# Patient Record
Sex: Male | Born: 1975
Health system: Southern US, Community
[De-identification: ages and names within clinical notes are randomized; demographics above are authoritative.]

## PROBLEM LIST (undated history)

## (undated) DIAGNOSIS — I10 Essential (primary) hypertension: Secondary | ICD-10-CM

## (undated) DIAGNOSIS — J45909 Unspecified asthma, uncomplicated: Secondary | ICD-10-CM

## (undated) HISTORY — PX: NO PAST SURGERIES: SHX2092

---

## 2001-07-15 ENCOUNTER — Emergency Department (HOSPITAL_COMMUNITY): Admission: EM | Admit: 2001-07-15 | Discharge: 2001-07-15 | Payer: Self-pay | Admitting: Emergency Medicine

## 2001-07-26 ENCOUNTER — Emergency Department (HOSPITAL_COMMUNITY): Admission: EM | Admit: 2001-07-26 | Discharge: 2001-07-26 | Payer: Self-pay | Admitting: Emergency Medicine

## 2002-02-05 ENCOUNTER — Emergency Department (HOSPITAL_COMMUNITY): Admission: EM | Admit: 2002-02-05 | Discharge: 2002-02-05 | Payer: Self-pay | Admitting: *Deleted

## 2002-10-02 ENCOUNTER — Emergency Department (HOSPITAL_COMMUNITY): Admission: EM | Admit: 2002-10-02 | Discharge: 2002-10-02 | Payer: Self-pay | Admitting: *Deleted

## 2002-10-20 ENCOUNTER — Emergency Department (HOSPITAL_COMMUNITY): Admission: EM | Admit: 2002-10-20 | Discharge: 2002-10-20 | Payer: Self-pay | Admitting: Emergency Medicine

## 2002-11-27 ENCOUNTER — Emergency Department (HOSPITAL_COMMUNITY): Admission: EM | Admit: 2002-11-27 | Discharge: 2002-11-27 | Payer: Self-pay | Admitting: Emergency Medicine

## 2003-07-15 ENCOUNTER — Emergency Department (HOSPITAL_COMMUNITY): Admission: EM | Admit: 2003-07-15 | Discharge: 2003-07-16 | Payer: Self-pay | Admitting: *Deleted

## 2003-11-09 ENCOUNTER — Emergency Department (HOSPITAL_COMMUNITY): Admission: EM | Admit: 2003-11-09 | Discharge: 2003-11-10 | Payer: Self-pay | Admitting: *Deleted

## 2004-01-04 ENCOUNTER — Emergency Department (HOSPITAL_COMMUNITY): Admission: EM | Admit: 2004-01-04 | Discharge: 2004-01-04 | Payer: Self-pay | Admitting: Emergency Medicine

## 2004-03-19 ENCOUNTER — Emergency Department (HOSPITAL_COMMUNITY): Admission: EM | Admit: 2004-03-19 | Discharge: 2004-03-19 | Payer: Self-pay | Admitting: *Deleted

## 2004-06-15 ENCOUNTER — Emergency Department (HOSPITAL_COMMUNITY): Admission: EM | Admit: 2004-06-15 | Discharge: 2004-06-15 | Payer: Self-pay | Admitting: Emergency Medicine

## 2006-05-15 ENCOUNTER — Emergency Department (HOSPITAL_COMMUNITY): Admission: EM | Admit: 2006-05-15 | Discharge: 2006-05-15 | Payer: Self-pay | Admitting: Emergency Medicine

## 2008-02-02 ENCOUNTER — Emergency Department (HOSPITAL_COMMUNITY): Admission: EM | Admit: 2008-02-02 | Discharge: 2008-02-02 | Payer: Self-pay | Admitting: Emergency Medicine

## 2009-02-15 ENCOUNTER — Emergency Department (HOSPITAL_COMMUNITY): Admission: EM | Admit: 2009-02-15 | Discharge: 2009-02-15 | Payer: Self-pay | Admitting: Emergency Medicine

## 2009-07-29 ENCOUNTER — Emergency Department (HOSPITAL_COMMUNITY): Admission: EM | Admit: 2009-07-29 | Discharge: 2009-07-29 | Payer: Self-pay | Admitting: Emergency Medicine

## 2010-03-30 ENCOUNTER — Ambulatory Visit: Payer: Self-pay | Admitting: Cardiology

## 2011-12-05 ENCOUNTER — Ambulatory Visit: Payer: Self-pay | Admitting: Family Medicine

## 2011-12-22 ENCOUNTER — Ambulatory Visit: Payer: Self-pay | Admitting: Family Medicine

## 2012-01-05 ENCOUNTER — Ambulatory Visit: Payer: Medicaid Other | Admitting: Family Medicine

## 2012-04-22 ENCOUNTER — Encounter (HOSPITAL_COMMUNITY): Payer: Self-pay | Admitting: *Deleted

## 2012-04-22 ENCOUNTER — Emergency Department (HOSPITAL_COMMUNITY)
Admission: EM | Admit: 2012-04-22 | Discharge: 2012-04-22 | Disposition: A | Discharge status: Home / Self Care | Payer: Medicaid Other | Attending: Emergency Medicine | Admitting: Emergency Medicine

## 2012-04-22 DIAGNOSIS — L732 Hidradenitis suppurativa: Secondary | ICD-10-CM | POA: Insufficient documentation

## 2012-04-22 DIAGNOSIS — F17200 Nicotine dependence, unspecified, uncomplicated: Secondary | ICD-10-CM | POA: Insufficient documentation

## 2012-04-22 DIAGNOSIS — S40869A Insect bite (nonvenomous) of unspecified upper arm, initial encounter: Secondary | ICD-10-CM

## 2012-04-22 DIAGNOSIS — I10 Essential (primary) hypertension: Secondary | ICD-10-CM | POA: Insufficient documentation

## 2012-04-22 HISTORY — DX: Essential (primary) hypertension: I10

## 2012-04-22 MED ORDER — DOXYCYCLINE HYCLATE 100 MG PO TABS
100.0000 mg | ORAL_TABLET | Freq: Once | ORAL | Status: AC
  Administered 2012-04-22: 100 mg via ORAL
  Filled 2012-04-22: qty 1

## 2012-04-22 MED ORDER — DOXYCYCLINE HYCLATE 100 MG PO CAPS: 100.0000 mg | ORAL_CAPSULE | Freq: Two times a day (BID) | ORAL | Status: AC

## 2012-04-22 MED ORDER — HYDROCODONE-ACETAMINOPHEN 5-325 MG PO TABS: ORAL_TABLET | ORAL | Status: AC

## 2012-04-22 MED ORDER — OXYCODONE-ACETAMINOPHEN 5-325 MG PO TABS
1.0000 | ORAL_TABLET | Freq: Once | ORAL | Status: AC
  Administered 2012-04-22: 1 via ORAL
  Filled 2012-04-22: qty 1

## 2012-04-22 NOTE — ED Provider Notes (Signed)
History     CSN: [id number]  Arrival date & time 04/22/12  2104   First MD Initiated Contact with Patient 04/22/12 2128      Chief Complaint  Patient presents with  . Insect Bite    (Consider location/radiation/quality/duration/timing/severity/associated sxs/prior treatment) HPI Comments: Patient c/o insect bite to his right upper arm and an abscess to the left axilla.  Symptoms have been present for several days.  C/o pain to the axilla and redness and itching to his upper right arm.  He denies fever, tick bite, joint pain or headache.  He states he has recently changed deodrant  The history is provided by the patient.    Past Medical History  Diagnosis Date  . Hypertension     History reviewed. No pertinent past surgical history.  History reviewed. No pertinent family history.  History  Substance Use Topics  . Smoking status: Current Everyday Smoker  . Smokeless tobacco: Not on file  . Alcohol Use: Yes      Review of Systems  Constitutional: Negative for fever and chills.  HENT: Negative for neck pain.   Musculoskeletal: Negative for arthralgias.  Skin:       Insect bite and abscess  Neurological: Negative for dizziness and headaches.  All other systems reviewed and are negative.    Allergies  Review of patient's allergies indicates no known allergies.  Home Medications   Current Outpatient Rx  Name Route Sig Dispense Refill  . ALBUTEROL SULFATE HFA 108 (90 BASE) MCG/ACT IN AERS Inhalation Inhale 2 puffs into the lungs every 6 (six) hours as needed.    Marland Kitchen UNKNOWN TO PATIENT Oral Take 1 tablet by mouth daily. For BP Medication      BP 133/85  Pulse 96  Temp 99.6 F (37.6 C) (Oral)  Resp 20  Ht 6\' 1"  (1.854 m)  Wt 312 lb (141.522 kg)  BMI 41.16 kg/m2  SpO2 99%  Physical Exam  Nursing note and vitals reviewed. Constitutional: He is oriented to person, place, and time. He appears well-developed and well-nourished. No distress.  HENT:  Head:  Normocephalic and atraumatic.  Cardiovascular: Normal rate, regular rhythm and normal heart sounds.   Pulmonary/Chest: Effort normal and breath sounds normal.  Musculoskeletal: He exhibits no edema.  Neurological: He is alert and oriented to person, place, and time. He exhibits normal muscle tone. Coordination normal.  Skin: Skin is warm. There is erythema.          Erythematous, indurated papule to the left axilla.  No surrounding erythema,  drainage or fluctuance.      ED Course  Procedures (including critical care time)      MDM    Hydradenitis of the of the left axillary region.  No significant fluctuance at this time. No drainage. Erythematous plaque to right upper arm.    Patient / Family / Caregiver understand and agree with initial ED impression and plan with expectations set for ED visit. Pt stable in ED with no significant deterioration in condition. Pt feels improved after observation and/or treatment in ED.     Prescribed:  Doxy norco #20  Diesha Rostad L. Meng [place], [place] 04/25/12 2044  Derinda Bartus L. [place], [place] 04/25/12 2045

## 2012-04-22 NOTE — Discharge Instructions (Signed)
Hidradenitis Suppurativa, Sweat Gland Abscess Hidradenitis suppurativa is a long lasting (chronic), uncommon disease of the sweat glands. With this, boil-like lumps and scarring develop in the groin, some times under the arms (axillae), and under the breasts. It may also uncommonly occur behind the ears, in the crease of the buttocks, and around the genitals.  CAUSES  The cause is from a blocking of the sweat glands. They then become infected. It may cause drainage and odor. It is not contagious. So it cannot be given to someone else. It most often shows up in puberty (about 67 to 36 years of age). But it may happen much later. It is similar to acne which is a disease of the sweat glands. This condition is slightly more common in African-Americans and women. SYMPTOMS   Hidradenitis usually starts as one or more red, tender, swellings in the groin or under the arms (axilla).   Over a period of hours to days the lesions get larger. They often open to the skin surface, draining clear to yellow-colored fluid.   The infected area heals with scarring.  DIAGNOSIS  Your caregiver makes this diagnosis by looking at you. Sometimes cultures (growing germs on plates in the lab) may be taken. This is to see what germ (bacterium) is causing the infection.  TREATMENT   Topical germ killing medicine applied to the skin (antibiotics) are the treatment of choice. Antibiotics taken by mouth (systemic) are sometimes needed when the condition is getting worse or is severe.   Avoid tight-fitting clothing which traps moisture in.   Dirt does not cause hidradenitis and it is not caused by poor hygiene.   Involved areas should be cleaned daily using an antibacterial soap. Some patients find that the liquid form of Lever 2000, applied to the involved areas as a lotion after bathing, can help reduce the odor related to this condition.   Sometimes surgery is needed to drain infected areas or remove scarred tissue.  Removal of large amounts of tissue is used only in severe cases.   Birth control pills may be helpful.   Oral retinoids (vitamin A derivatives) for 6 to 12 months which are effective for acne may also help this condition.   Weight loss will improve but not cure hidradenitis. It is made worse by being overweight. But the condition is not caused by being overweight.   This condition is more common in people who have had acne.   It may become worse under stress.  There is no medical cure for hidradenitis. It can be controlled, but not cured. The condition usually continues for years with periods of getting worse and getting better (remission). Document Released: 06/06/2004 Document Revised: 10/12/2011 Document Reviewed: 06/22/2008 [facility] Patient Information 2012 [employer], [place].Insect Bite Mosquitoes, flies, fleas, bedbugs, and many other insects can bite. Insect bites are different from insect stings. A sting is when venom is injected into the skin. Some insect bites can transmit infectious diseases. SYMPTOMS  Insect bites usually turn red, swell, and itch for 2 to 4 days. They often go away on their own. TREATMENT  Your caregiver may prescribe antibiotic medicines if a bacterial infection develops in the bite. HOME CARE INSTRUCTIONS  Do not scratch the bite area.   Keep the bite area clean and dry. Wash the bite area thoroughly with soap and water.   Put ice or cool compresses on the bite area.   Put ice in a plastic bag.   Place a towel between your skin  and the bag.   Leave the ice on for 20 minutes, 4 times a day for the first 2 to 3 days, or as directed.   You may apply a baking soda paste, cortisone cream, or calamine lotion to the bite area as directed by your caregiver. This can help reduce itching and swelling.   Only take over-the-counter or prescription medicines as directed by your caregiver.   If you are given antibiotics, take them as directed. Finish them even if  you start to feel better.  You may need a tetanus shot if:  You cannot remember when you had your last tetanus shot.   You have never had a tetanus shot.   The injury broke your skin.  If you get a tetanus shot, your arm may swell, get red, and feel warm to the touch. This is common and not a problem. If you need a tetanus shot and you choose not to have one, there is a rare chance of getting tetanus. Sickness from tetanus can be serious. SEEK IMMEDIATE MEDICAL CARE IF:   You have increased pain, redness, or swelling in the bite area.   You see a red line on the skin coming from the bite.   You have a fever.   You have joint pain.   You have a headache or neck pain.   You have unusual weakness.   You have a rash.   You have chest pain or shortness of breath.   You have abdominal pain, nausea, or vomiting.   You feel unusually tired or sleepy.  MAKE SURE YOU:   Understand these instructions.   Will watch your condition.   Will get help right away if you are not doing well or get worse.  Document Released: 11/30/2004 Document Revised: 10/12/2011 Document Reviewed: 05/24/2011 [facility] Patient Information 2012 [place], [place].

## 2012-04-22 NOTE — ED Notes (Signed)
Insect bite to rt upper arm.  Has abscess lt axilla.

## 2012-04-30 NOTE — ED Provider Notes (Signed)
Medical screening examination/treatment/procedure(s) were performed by non-physician practitioner and as supervising physician I was immediately available for consultation/collaboration.  Chae Shuster, MD 04/30/12 1744 

## 2012-06-03 DIAGNOSIS — R0602 Shortness of breath: Secondary | ICD-10-CM

## 2012-08-15 ENCOUNTER — Emergency Department (HOSPITAL_COMMUNITY)
Admission: EM | Admit: 2012-08-15 | Discharge: 2012-08-16 | Discharge status: Against Medical Advice | Payer: Medicaid Other | Attending: Emergency Medicine | Admitting: Emergency Medicine

## 2012-08-15 ENCOUNTER — Encounter (HOSPITAL_COMMUNITY): Payer: Self-pay | Admitting: Emergency Medicine

## 2012-08-15 DIAGNOSIS — H9209 Otalgia, unspecified ear: Secondary | ICD-10-CM | POA: Insufficient documentation

## 2012-08-15 DIAGNOSIS — R51 Headache: Secondary | ICD-10-CM | POA: Insufficient documentation

## 2012-08-15 NOTE — ED Notes (Signed)
Patient complaining of sharp pain to head and behind right ear.

## 2012-08-16 NOTE — ED Notes (Signed)
Hobson,pa went into room and patient had left without notifying staff. He was not present in his room. He had left before he could be evaluated by hobson,pa

## 2012-09-07 ENCOUNTER — Emergency Department (HOSPITAL_COMMUNITY)
Admission: EM | Admit: 2012-09-07 | Discharge: 2012-09-08 | Disposition: A | Discharge status: Home / Self Care | Payer: Medicaid Other | Attending: Emergency Medicine | Admitting: Emergency Medicine

## 2012-09-07 ENCOUNTER — Encounter (HOSPITAL_COMMUNITY): Payer: Self-pay | Admitting: Emergency Medicine

## 2012-09-07 DIAGNOSIS — R51 Headache: Secondary | ICD-10-CM | POA: Insufficient documentation

## 2012-09-07 DIAGNOSIS — S20169A Insect bite (nonvenomous) of breast, unspecified breast, initial encounter: Secondary | ICD-10-CM | POA: Insufficient documentation

## 2012-09-07 DIAGNOSIS — L02219 Cutaneous abscess of trunk, unspecified: Secondary | ICD-10-CM | POA: Insufficient documentation

## 2012-09-07 DIAGNOSIS — L03319 Cellulitis of trunk, unspecified: Secondary | ICD-10-CM | POA: Insufficient documentation

## 2012-09-07 DIAGNOSIS — F17200 Nicotine dependence, unspecified, uncomplicated: Secondary | ICD-10-CM | POA: Insufficient documentation

## 2012-09-07 DIAGNOSIS — Y939 Activity, unspecified: Secondary | ICD-10-CM | POA: Insufficient documentation

## 2012-09-07 DIAGNOSIS — L089 Local infection of the skin and subcutaneous tissue, unspecified: Secondary | ICD-10-CM | POA: Insufficient documentation

## 2012-09-07 DIAGNOSIS — Y929 Unspecified place or not applicable: Secondary | ICD-10-CM | POA: Insufficient documentation

## 2012-09-07 DIAGNOSIS — W57XXXA Bitten or stung by nonvenomous insect and other nonvenomous arthropods, initial encounter: Secondary | ICD-10-CM

## 2012-09-07 DIAGNOSIS — I10 Essential (primary) hypertension: Secondary | ICD-10-CM | POA: Insufficient documentation

## 2012-09-07 DIAGNOSIS — Z79899 Other long term (current) drug therapy: Secondary | ICD-10-CM | POA: Insufficient documentation

## 2012-09-07 MED ORDER — DIPHENHYDRAMINE HCL 25 MG PO CAPS
25.0000 mg | ORAL_CAPSULE | Freq: Once | ORAL | Status: AC
  Administered 2012-09-07: 25 mg via ORAL
  Filled 2012-09-07: qty 1

## 2012-09-07 MED ORDER — DOXYCYCLINE HYCLATE 100 MG PO TABS
100.0000 mg | ORAL_TABLET | Freq: Once | ORAL | Status: AC
  Administered 2012-09-07: 100 mg via ORAL
  Filled 2012-09-07: qty 1

## 2012-09-07 MED ORDER — IBUPROFEN 800 MG PO TABS: 800.0000 mg | ORAL_TABLET | Freq: Three times a day (TID) | ORAL | Status: DC

## 2012-09-07 MED ORDER — IBUPROFEN 800 MG PO TABS
800.0000 mg | ORAL_TABLET | Freq: Once | ORAL | Status: AC
  Administered 2012-09-07: 800 mg via ORAL
  Filled 2012-09-07: qty 1

## 2012-09-07 MED ORDER — DOXYCYCLINE HYCLATE 100 MG PO CAPS: 100.0000 mg | ORAL_CAPSULE | Freq: Two times a day (BID) | ORAL | Status: DC

## 2012-09-07 NOTE — ED Provider Notes (Signed)
History   This chart was scribed for [employer]. Colon Branch, MD by Toya Smothers. The patient was seen in room APA10/APA10. Patient's care was started at 2246.  CSN: [id number]  Arrival date & time 09/07/12  2246   First MD Initiated Contact with Patient 09/07/12 2307      Chief Complaint  Patient presents with  . Chest Pain  . Abscess   Patient is a 36 y.o. male presenting with chest pain and abscess. The history is provided by the patient. No language interpreter was used.  Chest Pain Pertinent negatives for primary symptoms include no fatigue, no cough and no abdominal pain.  Pertinent negatives for past medical history include no seizures.    Abscess  Pertinent negatives include no diarrhea, no congestion and no cough.    Hayden Villa is a 36 y.o. male with a h/o HTN who presents to the Emergency Department complaining of 12 hours of gradual onset, gradually worsening abscess to the left chest. Pain is moderate,  Constant, aggravated with palpation, and alleviated by nothing. Pt believes he was bitten by an insect and also c/o mild associate HA as the result of abscess pain. Symptoms have not been treated PTA. No fever, chills, cough, congestion, rhinorrhea, chest pain, SOB, or n/v/d. Pt is a current everyday smoker, admits alcohol consumption, and marijuana use.  Pt lists PCP as Dr. Rhona Raider in [place]   Past Medical History  Diagnosis Date  . Hypertension     History reviewed. No pertinent past surgical history.  History reviewed. No pertinent family history.  History  Substance Use Topics  . Smoking status: Current Every Day Smoker  . Smokeless tobacco: Not on file  . Alcohol Use: Yes    Review of Systems  Constitutional: Negative for fatigue.  HENT: Negative for congestion, sinus pressure and ear discharge.   Eyes: Negative for discharge.  Respiratory: Negative for cough.   Cardiovascular: Negative for chest pain.  Gastrointestinal: Negative for abdominal pain and diarrhea.   Genitourinary: Negative for frequency and hematuria.  Musculoskeletal: Negative for back pain.  Skin: Positive for wound. Negative for rash.  Neurological: Positive for headaches. Negative for seizures.  Hematological: Negative.   Psychiatric/Behavioral: Negative for hallucinations.  All other systems reviewed and are negative.    Allergies  Review of patient's allergies indicates no known allergies.  Home Medications   Current Outpatient Rx  Name Route Sig Dispense Refill  . ALBUTEROL SULFATE HFA 108 (90 BASE) MCG/ACT IN AERS Inhalation Inhale 2 puffs into the lungs every 6 (six) hours as needed.    Marland Kitchen UNKNOWN TO PATIENT Oral Take 1 tablet by mouth daily. For BP Medication      BP 130/66  Pulse 84  Temp 98.3 F (36.8 C) (Oral)  Resp 20  Ht 6\' 1"  (1.854 m)  Wt 301 lb (136.533 kg)  BMI 39.71 kg/m2  SpO2 97%  Physical Exam  Constitutional: He is oriented to person, place, and time. He appears well-developed and well-nourished. No distress.  HENT:  Head: Normocephalic and atraumatic.  Mouth/Throat: Oropharynx is clear and moist. No oropharyngeal exudate.  Eyes: EOM are normal. Pupils are equal, round, and reactive to light. Right eye exhibits no discharge. Left eye exhibits no discharge. No scleral icterus.  Neck: Normal range of motion. Neck supple. No tracheal deviation present.  Cardiovascular: Normal rate, regular rhythm and normal heart sounds.   No murmur heard. Pulmonary/Chest: Effort normal and breath sounds normal. No respiratory distress.  Abdominal: Soft. Bowel  sounds are normal. There is no tenderness.  Musculoskeletal: Normal range of motion. He exhibits no edema.  Lymphadenopathy:    He has no cervical adenopathy.  Neurological: He is alert and oriented to person, place, and time. Coordination normal.  Skin: No rash noted. He is not diaphoretic.       Focal area of cellulitis 2.5 cm in diameter. Red and slightly tender.     ED Course  Procedures    DIAGNOSTIC STUDIES: Oxygen Saturation is 97% on room air, normal by my interpretation.    COORDINATION OF CARE: 23:21- Evaluated Pt. Pt is awake, alert, and without distress. 23:25- Patient understand and agree with initial ED impression and plan with expectations set for ED visit.   Date: 09/07/2012   2303  Rate: 85  Rhythm: normal sinus rhythm with 1st degree AV block  QRS Axis: normal  Intervals: normal  ST/T Wave abnormalities: normal  Conduction Disutrbances: none  Narrative Interpretation: unremarkable      MDM  Patient with infected insect bite to left chest. Initiated antibiotic therapy, benadryl and antiinflammatory.Pt stable in ED with no significant deterioration in condition.The patient appears reasonably screened and/or stabilized for discharge and I doubt any other medical condition or other [facility] requiring further screening, evaluation, or treatment in the ED at this time prior to discharge.  I personally performed the services described in this documentation, which was scribed in my presence. The recorded information has been reviewed and considered.   MDM Reviewed: nursing note and vitals Interpretation: ECG  completed by me          Nicoletta Dress. Colon Branch, MD 09/07/12 2342

## 2012-09-07 NOTE — ED Notes (Addendum)
Patient reports left side chest pain that radiates into upper chest and to neck. Reports around same time noticed raised spot on left chest and it appears that he has possibly been bit by spider.

## 2012-09-08 NOTE — ED Notes (Signed)
Pt discharged. Pt stable at time of discharge. Medications reviewed pt has no questions regarding discharge at this time. Pt voiced understanding of discharge instructions.  

## 2012-09-17 NOTE — ED Provider Notes (Signed)
History     CSN: [id number]  Arrival date & time 08/15/12  2252   First MD Initiated Contact with Patient 08/15/12 2328      Chief Complaint  Patient presents with  . Headache    (Consider location/radiation/quality/duration/timing/severity/associated sxs/prior treatment) HPI Comments: Pt left the ED before being evaluated by a provider  Patient is a 36 y.o. male presenting with headaches.  Headache     Past Medical History  Diagnosis Date  . Hypertension     History reviewed. No pertinent past surgical history.  History reviewed. No pertinent family history.  History  Substance Use Topics  . Smoking status: Current Every Day Smoker  . Smokeless tobacco: Not on file  . Alcohol Use: Yes      Review of Systems  Neurological: Positive for headaches.    Allergies  Review of patient's allergies indicates no known allergies.  Home Medications   Current Outpatient Rx  Name  Route  Sig  Dispense  Refill  . ALBUTEROL SULFATE HFA 108 (90 BASE) MCG/ACT IN AERS   Inhalation   Inhale 2 puffs into the lungs every 6 (six) hours as needed.         Marland Kitchen DOXYCYCLINE HYCLATE 100 MG PO CAPS   Oral   Take 1 capsule (100 mg total) by mouth 2 (two) times daily.   20 capsule   0   . IBUPROFEN 800 MG PO TABS   Oral   Take 1 tablet (800 mg total) by mouth 3 (three) times daily.   21 tablet   0   . UNKNOWN TO PATIENT   Oral   Take 1 tablet by mouth daily. For BP Medication           BP 128/71  Pulse 87  Temp 98.5 F (36.9 C) (Oral)  Resp 16  Ht 6\' 1"  (1.854 m)  Wt 302 lb (136.986 kg)  BMI 39.84 kg/m2  SpO2 98%  Physical Exam  ED Course  Procedures (including critical care time)  Labs Reviewed - No data to display No results found.   No diagnosis found.    MDM   Pt left the ED before being evaluated by a provider.       Kathie Dike, [place] 09/17/12 2038

## 2012-09-17 NOTE — ED Provider Notes (Signed)
Medical screening examination/treatment/procedure(s) were performed by non-physician practitioner and as supervising physician I was immediately available for consultation/collaboration.   Benny Lennert, MD 09/17/12 2141

## 2012-11-11 ENCOUNTER — Emergency Department (HOSPITAL_COMMUNITY)
Admission: EM | Admit: 2012-11-11 | Discharge: 2012-11-12 | Disposition: A | Discharge status: Home / Self Care | Payer: Medicaid Other | Attending: Emergency Medicine | Admitting: Emergency Medicine

## 2012-11-11 DIAGNOSIS — Z79899 Other long term (current) drug therapy: Secondary | ICD-10-CM | POA: Insufficient documentation

## 2012-11-11 DIAGNOSIS — IMO0002 Reserved for concepts with insufficient information to code with codable children: Secondary | ICD-10-CM | POA: Insufficient documentation

## 2012-11-11 DIAGNOSIS — R109 Unspecified abdominal pain: Secondary | ICD-10-CM | POA: Insufficient documentation

## 2012-11-11 DIAGNOSIS — J45909 Unspecified asthma, uncomplicated: Secondary | ICD-10-CM | POA: Insufficient documentation

## 2012-11-11 DIAGNOSIS — F17200 Nicotine dependence, unspecified, uncomplicated: Secondary | ICD-10-CM | POA: Insufficient documentation

## 2012-11-11 DIAGNOSIS — I10 Essential (primary) hypertension: Secondary | ICD-10-CM | POA: Insufficient documentation

## 2012-11-11 HISTORY — DX: Unspecified asthma, uncomplicated: J45.909

## 2012-11-12 ENCOUNTER — Emergency Department (HOSPITAL_COMMUNITY): Payer: Medicaid Other

## 2012-11-12 ENCOUNTER — Encounter (HOSPITAL_COMMUNITY): Payer: Self-pay | Admitting: *Deleted

## 2012-11-12 LAB — CBC WITH DIFFERENTIAL/PLATELET
Basophils Absolute: 0 10*3/uL (ref 0.0–0.1)
Basophils Relative: 0 % (ref 0–1)
Eosinophils Absolute: 0.1 10*3/uL (ref 0.0–0.7)
Eosinophils Relative: 1 % (ref 0–5)
HCT: 42.9 % (ref 39.0–52.0)
Hemoglobin: 14.4 g/dL (ref 13.0–17.0)
Lymphocytes Relative: 52 % — ABNORMAL HIGH (ref 12–46)
Lymphs Abs: 3.3 10*3/uL (ref 0.7–4.0)
MCH: 29 pg (ref 26.0–34.0)
MCHC: 33.6 g/dL (ref 30.0–36.0)
MCV: 86.5 fL (ref 78.0–100.0)
Monocytes Absolute: 0.7 10*3/uL (ref 0.1–1.0)
Monocytes Relative: 12 % (ref 3–12)
Neutro Abs: 2.2 10*3/uL (ref 1.7–7.7)
Neutrophils Relative %: 35 % — ABNORMAL LOW (ref 43–77)
RBC: 4.96 MIL/uL (ref 4.22–5.81)

## 2012-11-12 LAB — COMPREHENSIVE METABOLIC PANEL
ALT: 23 U/L (ref 0–53)
AST: 21 U/L (ref 0–37)
Albumin: 4.1 g/dL (ref 3.5–5.2)
Alkaline Phosphatase: 63 U/L (ref 39–117)
BUN: 14 mg/dL (ref 6–23)
CO2: 29 mEq/L (ref 19–32)
Calcium: 9.1 mg/dL (ref 8.4–10.5)
Chloride: 102 mEq/L (ref 96–112)
Creatinine, Ser: 1.01 mg/dL (ref 0.50–1.35)
GFR calc Af Amer: >90 mL/min (ref >90)
Glucose, Bld: 104 mg/dL — ABNORMAL HIGH (ref 70–99)
Potassium: 3.8 mEq/L (ref 3.5–5.1)
Sodium: 140 mEq/L (ref 135–145)
Total Bilirubin: 0.2 mg/dL — ABNORMAL LOW (ref 0.3–1.2)
Total Protein: 7.4 g/dL (ref 6.0–8.3)

## 2012-11-12 LAB — URINALYSIS, ROUTINE W REFLEX MICROSCOPIC
Bilirubin Urine: NEGATIVE
Glucose, UA: 100 mg/dL — AB
Hgb urine dipstick: NEGATIVE
Ketones, ur: NEGATIVE mg/dL
Leukocytes, UA: NEGATIVE
Nitrite: NEGATIVE
Protein, ur: NEGATIVE mg/dL
Specific Gravity, Urine: 1.025 (ref 1.005–1.030)
pH: 6.5 (ref 5.0–8.0)

## 2012-11-12 MED ORDER — HYDROCODONE-ACETAMINOPHEN 5-325 MG PO TABS: 2.0000 | ORAL_TABLET | Freq: Four times a day (QID) | ORAL | Status: DC | PRN

## 2012-11-12 NOTE — ED Notes (Signed)
Pt has lower abd pain and groin pain x1 month, states pain is intermittent but that today the pain has gotten considerably worse. Abd nontender at this time, appears non distended. Denies groin swelling.

## 2012-11-12 NOTE — ED Provider Notes (Signed)
History     CSN: [id number]  Arrival date & time 11/11/12  2355   First MD Initiated Contact with Patient 11/12/12 0023      Chief Complaint  Patient presents with  . Abdominal Pain  . Groin Pain    (Consider location/radiation/quality/duration/timing/severity/associated sxs/prior treatment) HPI Comments: Patient presents with a several week history of lower abdominal pain that radiates into the right groin.  He reports the pain comes and goes.  He denies any aggravating or alleviating factors.  There are no urinary or bowel complaints.  No fevers or chills.  No history of kidney stones.  He does have a history of a ventral hernia with repair in the past.    Patient is a 37 y.o. male presenting with abdominal pain. The history is provided by the patient.  Abdominal Pain The primary symptoms of the illness include abdominal pain. The primary symptoms of the illness do not include fever, nausea, vomiting or dysuria. Episode onset: several months ago. The onset of the illness was gradual. The problem has been gradually worsening.  The patient has not had a change in bowel habit. Symptoms associated with the illness do not include chills or constipation.    Past Medical History  Diagnosis Date  . Hypertension   . Asthma     History reviewed. No pertinent past surgical history.  History reviewed. No pertinent family history.  History  Substance Use Topics  . Smoking status: Current Every Day Smoker -- 1.5 packs/day    Types: Cigarettes  . Smokeless tobacco: Not on file  . Alcohol Use: Yes      Review of Systems  Constitutional: Negative for fever and chills.  Gastrointestinal: Positive for abdominal pain. Negative for nausea, vomiting and constipation.  Genitourinary: Negative for dysuria.  All other systems reviewed and are negative.    Allergies  Review of patient's allergies indicates no known allergies.  Home Medications   Current Outpatient Rx  Name  Route   Sig  Dispense  Refill  . ALBUTEROL SULFATE HFA 108 (90 BASE) MCG/ACT IN AERS   Inhalation   Inhale 2 puffs into the lungs every 6 (six) hours as needed.         Marland Kitchen DOXYCYCLINE HYCLATE 100 MG PO CAPS   Oral   Take 1 capsule (100 mg total) by mouth 2 (two) times daily.   20 capsule   0   . IBUPROFEN 800 MG PO TABS   Oral   Take 1 tablet (800 mg total) by mouth 3 (three) times daily.   21 tablet   0   . UNKNOWN TO PATIENT   Oral   Take 1 tablet by mouth daily. For BP Medication           BP 117/79  Pulse 91  Temp 99.2 F (37.3 C) (Oral)  SpO2 96%  Physical Exam  Nursing note and vitals reviewed. Constitutional: He is oriented to person, place, and time. He appears well-developed and well-nourished. No distress.  HENT:  Head: Normocephalic and atraumatic.  Mouth/Throat: Oropharynx is clear and moist.  Neck: Normal range of motion. Neck supple.  Cardiovascular: Normal rate and regular rhythm.   No murmur heard. Pulmonary/Chest: Effort normal and breath sounds normal. No respiratory distress.  Abdominal: Soft. Bowel sounds are normal.       There is ttp in the suprapubic region without rebound or guarding.  The bowel sounds are active.    Genitourinary:       The  penis and testicles appear grossly normal.  There is no palpable inguinal or ventral hernia.    Musculoskeletal: Normal range of motion. He exhibits no edema.  Neurological: He is alert and oriented to person, place, and time.  Skin: Skin is warm and dry. He is not diaphoretic.    ED Course  Procedures (including critical care time)  Labs Reviewed  CBC WITH DIFFERENTIAL - Abnormal; Notable for the following:    Neutrophils Relative 35 (*)     Lymphocytes Relative 52 (*)     All other components within normal limits  COMPREHENSIVE METABOLIC PANEL - Abnormal; Notable for the following:    Glucose, Bld 104 (*)     Total Bilirubin 0.2 (*)     All other components within normal limits  URINALYSIS, ROUTINE  W REFLEX MICROSCOPIC - Abnormal; Notable for the following:    Glucose, UA 100 (*)     All other components within normal limits   Ct Abdomen Pelvis Wo Contrast  11/12/2012  *RADIOLOGY REPORT*  Clinical Data: Lower abdominal pain radiating into the right groin.  CT ABDOMEN AND PELVIS WITHOUT CONTRAST  Technique:  Multidetector CT imaging of the abdomen and pelvis was performed following the standard protocol without intravenous contrast.  Comparison: CT abdomen and pelvis 09/02/2010.  Findings: The lung bases are clear.  No pleural or pericardial effusion.  No renal or ureteral stones are identified.  The kidneys are unremarkable.  The gallbladder, liver, spleen, adrenal glands and pancreas appear normal.  Seminal vesicles, urinary bladder and prostate gland appear normal.  There is no lymphadenopathy or fluid.  The stomach, small and large bowel and appendix appear normal.  No focal bony abnormality is identified. There is vacuum disc phenomenon and endplate sclerosis at the L4-5 level.  IMPRESSION: Negative for urinary tract stone.  No acute finding.  Degenerative disc disease L4-5.   Original Report Authenticated By: Holley Dexter, M.D.      No diagnosis found.    MDM  The patient presents with pain in the suprapubic region radiating in to the right groin.  There is no evidence of uti, and the ct fails to reveal a kidney stone or alternate pathology.  There a no hernias appreciated on exam or on the ct.  I am unable to find an emergent reason for his pain.  I will treat the symptoms of pain, and he is to follow up with his pcp, return prn if he worsens.          Geoffery Lyons, MD 11/12/12 [id number]

## 2012-11-12 NOTE — ED Notes (Signed)
Pt c/o lower abd pain radiating to groin area. Pt describes pain as sharp.

## 2015-12-23 ENCOUNTER — Encounter: Payer: Self-pay | Admitting: Orthopaedic Surgery

## 2015-12-23 ENCOUNTER — Ambulatory Visit: Payer: Medicaid Other | Admitting: Orthopaedic Surgery

## 2015-12-24 ENCOUNTER — Telehealth: Payer: Self-pay | Admitting: *Deleted

## 2015-12-24 MED ORDER — OXYCODONE-ACETAMINOPHEN 7.5-325 MG PO TABS: 1.0000 | ORAL_TABLET | ORAL | Status: DC | PRN

## 2015-12-24 NOTE — Telephone Encounter (Signed)
Rx printed

## 2015-12-24 NOTE — Telephone Encounter (Signed)
Patient called requesting percet 05/7324 qty 120 to be refilled

## 2015-12-27 NOTE — Telephone Encounter (Signed)
Prescription available, called patient, no answer 

## 2016-01-13 ENCOUNTER — Ambulatory Visit: Payer: Medicaid Other | Admitting: Orthopaedic Surgery

## 2016-01-25 ENCOUNTER — Ambulatory Visit (INDEPENDENT_AMBULATORY_CARE_PROVIDER_SITE_OTHER): Payer: Medicare Other | Admitting: Orthopaedic Surgery

## 2016-01-25 VITALS — BP 155/74 | HR 91 | Temp 97.7°F | Ht 72.0 in | Wt 305.0 lb

## 2016-01-25 DIAGNOSIS — M545 Low back pain, unspecified: Secondary | ICD-10-CM

## 2016-01-25 MED ORDER — ACETAMINOPHEN-CODEINE #3 300-30 MG PO TABS: ORAL_TABLET | ORAL | Status: DC

## 2016-01-25 MED ORDER — OXYCODONE-ACETAMINOPHEN 5-325 MG PO TABS: 1.0000 | ORAL_TABLET | ORAL | Status: DC | PRN

## 2016-01-25 NOTE — Progress Notes (Signed)
Patient [name], male DOB:December 13, 1975, 40 y.o. AVW:[id number]  Chief Complaint  Patient presents with  . Follow-up    back "worse"    HPI  Hayden Villa is a 40 y.o. male who has chronic lower back pain.  He has stable pain.  He says he got a little worse over the last week.  He has no trauma.  He has no paresthesias.  He has no bowel or bladder problem.  He is taking his medicine and doing his exercises.  HPI  Body mass index is 41.36 kg/(m^2).  Review of Systems  Patient does not have Diabetes Mellitus. Patient does not have hypertension. Patient does not have COPD or shortness of breath. Patient has BMI > 35. Patient does not have current smoking history.  Review of Systems  HENT: Negative for congestion.   Respiratory: Negative for cough and shortness of breath.   Cardiovascular: Negative for chest pain.  Endocrine: Positive for cold intolerance.  Musculoskeletal: Positive for back pain and arthralgias.  Allergic/Immunologic: Positive for environmental allergies.    Past Medical History  Diagnosis Date  . Hypertension   . Asthma     No past surgical history on file.  No family history on file.  Social History Social History  Substance Use Topics  . Smoking status: Current Every Day Smoker -- 1.50 packs/day    Types: Cigarettes  . Smokeless tobacco: Not on file  . Alcohol Use: Yes    No Known Allergies  Current Outpatient Prescriptions  Medication Sig Dispense Refill  . albuterol (VENTOLIN HFA) 108 (90 BASE) MCG/ACT inhaler Inhale 2 puffs into the lungs every 6 (six) hours as needed.    . doxycycline (VIBRAMYCIN) 100 MG capsule Take 1 capsule (100 mg total) by mouth 2 (two) times daily. 20 capsule 0  . ibuprofen (ADVIL,MOTRIN) 800 MG tablet Take 1 tablet (800 mg total) by mouth 3 (three) times daily. 21 tablet 0  . UNKNOWN TO PATIENT Take 1 tablet by mouth daily. For BP Medication    . oxyCODONE-acetaminophen (PERCOCET/ROXICET) 5-325 MG tablet  Take 1 tablet by mouth every 4 (four) hours as needed for moderate pain or severe pain (Must last 30 days.  Do not drive or operate machinery while taking this medicine). 120 tablet 0   No current facility-administered medications for this visit.     Physical Exam  Blood pressure 155/74, pulse 91, temperature 97.7 F (36.5 C), height 6' (1.829 m), weight 305 lb (138.347 kg).  Constitutional: overall normal hygiene, normal nutrition, well developed, normal grooming, normal body habitus. Assistive device:none  Musculoskeletal: gait and station Limp none, muscle tone and strength are normal, no tremors or atrophy is present.  .  Neurological: coordination overall normal.  Deep tendon reflex/nerve stretch intact.  Sensation normal.  Cranial nerves II-XII intact.   Skin:   normal overall no scars, lesions, ulcers or rashes. No psoriasis.  Psychiatric: Alert and oriented x 3.  Recent memory intact, remote memory unclear.  Normal mood and affect. Well groomed.  Good eye contact.  Cardiovascular: overall no swelling, no varicosities, no edema bilaterally, normal temperatures of the legs and arms, no clubbing, cyanosis and good capillary refill.  Lymphatic: palpation is normal.  Spine/Pelvis examination:  Inspection:  Overall, sacoiliac joint benign and hips nontender; without crepitus or defects.   Thoracic spine inspection: Alignment normal without kyphosis present   Lumbar spine inspection:  Alignment  with normal lumbar lordosis, without scoliosis apparent.   Thoracic spine palpation:  without  tenderness of spinal processes   Lumbar spine palpation: with tenderness of lumbar area; without tightness of lumbar muscles    Range of Motion:   Lumbar flexion, forward flexion is 35  without pain or tenderness    Lumbar extension is 10  without pain or tenderness   Left lateral bend is Normal  without pain or tenderness   Right lateral bend is Normal without pain or tenderness   Straight  leg raising is Normal   Strength & tone: Normal   Stability overall normal stability     Additional services performed: I talked about weight reduction and cutting back on smoking.  I am cutting dosage of pain medicine today as well.  The patient has been educated about the nature of the problem(s) and counseled on treatment options.  The patient appeared to understand what I have discussed and is in agreement with it.  PLAN Call if any problems.  Precautions discussed.  Continue current medications.   Return to clinic three months.

## 2016-01-27 ENCOUNTER — Ambulatory Visit: Payer: Medicaid Other | Admitting: Orthopaedic Surgery

## 2016-02-22 ENCOUNTER — Telehealth: Payer: Self-pay | Admitting: Orthopaedic Surgery

## 2016-02-22 NOTE — Telephone Encounter (Signed)
Patient called for refill of medication: oxyCODONE-acetaminophen (PERCOCET/ROXICET) 5-325 MG tablet [[id number]][[id number]] - quantity 120. Please advise.

## 2016-02-22 NOTE — Telephone Encounter (Signed)
Patient notified

## 2016-02-22 NOTE — Telephone Encounter (Signed)
Not due until 4-21.  Tell me again on 4-20

## 2016-02-24 ENCOUNTER — Telehealth: Payer: Self-pay | Admitting: Orthopaedic Surgery

## 2016-02-24 MED ORDER — OXYCODONE-ACETAMINOPHEN 5-325 MG PO TABS
1.0000 | ORAL_TABLET | ORAL | Status: DC | PRN
Start: 2016-02-24 — End: 2016-03-23

## 2016-02-24 NOTE — Telephone Encounter (Signed)
Oxycodone- Acetaminophen 5/325mg   Qty 120 Tablets °

## 2016-02-24 NOTE — Telephone Encounter (Signed)
Routing back to Dr Hilda LiasKeeling for review and approval of refill Oxycodone-acetaminophen/Percocet/Roxicet 5-325, as noted 02/22/16 telephone note.

## 2016-02-24 NOTE — Telephone Encounter (Signed)
Rx done. 

## 2016-03-23 ENCOUNTER — Telehealth: Payer: Self-pay | Admitting: Orthopaedic Surgery

## 2016-03-23 MED ORDER — OXYCODONE-ACETAMINOPHEN 5-325 MG PO TABS: 1.0000 | ORAL_TABLET | ORAL | Status: DC | PRN

## 2016-03-23 NOTE — Telephone Encounter (Signed)
Rx done. 

## 2016-03-23 NOTE — Telephone Encounter (Signed)
Patient called and requested a refill on Oxycodone-Acetaminophen 5-325mg s.  Qty  120   Sig: Take 1 tablet by mouth every 4 (four) hours as needed for moderate pain or severe pain (Must last 30 days. Do not drive or operate machinery while taking this medicine).

## 2016-04-20 ENCOUNTER — Telehealth: Payer: Self-pay | Admitting: Orthopaedic Surgery

## 2016-04-20 MED ORDER — OXYCODONE-ACETAMINOPHEN 5-325 MG PO TABS: 1.0000 | ORAL_TABLET | ORAL | Status: DC | PRN

## 2016-04-20 NOTE — Telephone Encounter (Signed)
Oxycodone- Acetaminophen 5/325mg   Qty 120 Tablets °

## 2016-04-20 NOTE — Telephone Encounter (Signed)
Rx done. 

## 2016-04-26 ENCOUNTER — Ambulatory Visit: Payer: Managed Care, Other (non HMO) | Admitting: Orthopaedic Surgery

## 2016-05-10 ENCOUNTER — Encounter: Payer: Self-pay | Admitting: Orthopaedic Surgery

## 2016-05-10 ENCOUNTER — Ambulatory Visit (INDEPENDENT_AMBULATORY_CARE_PROVIDER_SITE_OTHER): Payer: Medicare Other | Admitting: Orthopaedic Surgery

## 2016-05-10 VITALS — BP 123/83 | HR 81 | Resp 97 | Ht 72.0 in | Wt 301.0 lb

## 2016-05-10 DIAGNOSIS — M545 Low back pain, unspecified: Secondary | ICD-10-CM

## 2016-05-10 MED ORDER — OXYCODONE-ACETAMINOPHEN 7.5-325 MG PO TABS: 1.0000 | ORAL_TABLET | ORAL | Status: DC | PRN

## 2016-05-10 NOTE — Progress Notes (Signed)
[name], male DOB:Dec 25, 1975, 40 y.o. [id number]  Chief Complaint  Patient presents with  . Follow-up    back and bilateral arm pain     HPI  Hayden Villa is a 40 y.o. male who has chronic lower back pain. It is a little worse. He has no paresthesias.  He has no bowel or bladder problems.  He is doing his exercises.  He has tried to be active.  He has no new trauma.  HPI  Body mass index is 40.81 kg/(m^2).  ROS  Review of Systems  HENT: Negative for congestion.   Respiratory: Negative for cough and shortness of breath.   Cardiovascular: Negative for chest pain.  Endocrine: Positive for cold intolerance.  Musculoskeletal: Positive for back pain and arthralgias.  Allergic/Immunologic: Positive for environmental allergies.    Past Medical History  Diagnosis Date  . Hypertension   . Asthma     History reviewed. No pertinent past surgical history.  History reviewed. No pertinent family history.  Social History Social History  Substance Use Topics  . Smoking status: Current Every Day Smoker -- 1.50 packs/day    Types: Cigarettes  . Smokeless tobacco: None  . Alcohol Use: Yes    No Known Allergies  Current Outpatient Prescriptions  Medication Sig Dispense Refill  . albuterol (VENTOLIN HFA) 108 (90 BASE) MCG/ACT inhaler Inhale 2 puffs into the lungs every 6 (six) hours as needed.    . doxycycline (VIBRAMYCIN) 100 MG capsule Take 1 capsule (100 mg total) by mouth 2 (two) times daily. 20 capsule 0  . ibuprofen (ADVIL,MOTRIN) 800 MG tablet Take 1 tablet (800 mg total) by mouth 3 (three) times daily. 21 tablet 0  . UNKNOWN TO PATIENT Take 1 tablet by mouth daily. For BP Medication    . oxyCODONE-acetaminophen (PERCOCET) 7.5-325 MG tablet Take 1 tablet by mouth every 4 (four) hours as needed for moderate pain or severe pain (Must last 30 days.Do not take and drive a car or operate machinery). 120 tablet 0   No current facility-administered medications  for this visit.     Physical Exam  Blood pressure 123/83, pulse 81, resp. rate 97, height 6' (1.829 m), weight 301 lb (136.533 kg).  Constitutional: overall normal hygiene, normal nutrition, well developed, normal grooming, normal body habitus. Assistive device:none  Musculoskeletal: gait and station Limp none, muscle tone and strength are normal, no tremors or atrophy is present.  .  Neurological: coordination overall normal.  Deep tendon reflex/nerve stretch intact.  Sensation normal.  Cranial nerves II-XII intact.   Skin:   normal overall no scars, lesions, ulcers or rashes. No psoriasis.  Psychiatric: Alert and oriented x 3.  Recent memory intact, remote memory unclear.  Normal mood and affect. Well groomed.  Good eye contact.  Cardiovascular: overall no swelling, no varicosities, no edema bilaterally, normal temperatures of the legs and arms, no clubbing, cyanosis and good capillary refill.  Lymphatic: palpation is normal.  Spine/Pelvis examination:  Inspection:  Overall, sacoiliac joint benign and hips nontender; without crepitus or defects.   Thoracic spine inspection: Alignment normal without kyphosis present   Lumbar spine inspection:  Alignment  with normal lumbar lordosis, without scoliosis apparent.   Thoracic spine palpation:  without tenderness of spinal processes   Lumbar spine palpation: with tenderness of lumbar area; without tightness of lumbar muscles    Range of Motion:   Lumbar flexion, forward flexion is 35  with pain or tenderness    Lumbar extension is  10  with pain or tenderness   Left lateral bend is Normal  without pain or tenderness   Right lateral bend is Normal without pain or tenderness   Straight leg raising is Normal   Strength & tone: Normal   Stability overall normal stability     The patient has been educated about the nature of the problem(s) and counseled on treatment options.  The patient appeared to understand what I have  discussed and is in agreement with it.  Encounter Diagnosis  Name Primary?  . Midline low back pain without sciatica Yes    PLAN Call if any problems.  Precautions discussed.  Continue current medications.   Return to clinic 3 months   Electronically Signed Darreld McleanWayne Tiffanee Mcnee, MD 7/5/20172:27 PM

## 2016-06-07 ENCOUNTER — Telehealth: Payer: Self-pay | Admitting: Orthopaedic Surgery

## 2016-06-07 MED ORDER — OXYCODONE-ACETAMINOPHEN 7.5-325 MG PO TABS: 1.0000 | ORAL_TABLET | ORAL | 0 refills | Status: DC | PRN

## 2016-06-07 NOTE — Telephone Encounter (Signed)
Patient called and requested a refill on Oxycodone-Acetaminophen (Percocet)  7.5-325 mgs.   Qty  120    Sig: Take 1 tablet by mouth every 4 (four) hours as needed for moderate pain or severe pain (Must last 30 days.  Do not take and drive a car or operate machinery). °

## 2016-06-15 ENCOUNTER — Encounter (HOSPITAL_COMMUNITY): Payer: Self-pay | Admitting: Emergency Medicine

## 2016-06-15 ENCOUNTER — Emergency Department (HOSPITAL_COMMUNITY)
Admission: EM | Admit: 2016-06-15 | Discharge: 2016-06-15 | Disposition: A | Discharge status: Home / Self Care | Payer: Medicare Other | Attending: Emergency Medicine | Admitting: Emergency Medicine

## 2016-06-15 DIAGNOSIS — S90862A Insect bite (nonvenomous), left foot, initial encounter: Secondary | ICD-10-CM | POA: Diagnosis not present

## 2016-06-15 DIAGNOSIS — Y939 Activity, unspecified: Secondary | ICD-10-CM | POA: Diagnosis not present

## 2016-06-15 DIAGNOSIS — Y999 Unspecified external cause status: Secondary | ICD-10-CM | POA: Diagnosis not present

## 2016-06-15 DIAGNOSIS — W57XXXA Bitten or stung by nonvenomous insect and other nonvenomous arthropods, initial encounter: Secondary | ICD-10-CM | POA: Insufficient documentation

## 2016-06-15 DIAGNOSIS — S90465A Insect bite (nonvenomous), left lesser toe(s), initial encounter: Secondary | ICD-10-CM | POA: Diagnosis present

## 2016-06-15 DIAGNOSIS — F17210 Nicotine dependence, cigarettes, uncomplicated: Secondary | ICD-10-CM | POA: Diagnosis not present

## 2016-06-15 DIAGNOSIS — Z79899 Other long term (current) drug therapy: Secondary | ICD-10-CM | POA: Insufficient documentation

## 2016-06-15 DIAGNOSIS — L089 Local infection of the skin and subcutaneous tissue, unspecified: Secondary | ICD-10-CM | POA: Diagnosis not present

## 2016-06-15 DIAGNOSIS — I10 Essential (primary) hypertension: Secondary | ICD-10-CM | POA: Insufficient documentation

## 2016-06-15 DIAGNOSIS — J45909 Unspecified asthma, uncomplicated: Secondary | ICD-10-CM | POA: Insufficient documentation

## 2016-06-15 DIAGNOSIS — Y929 Unspecified place or not applicable: Secondary | ICD-10-CM | POA: Diagnosis not present

## 2016-06-15 DIAGNOSIS — M25571 Pain in right ankle and joints of right foot: Secondary | ICD-10-CM | POA: Diagnosis not present

## 2016-06-15 MED ORDER — ONDANSETRON HCL 4 MG PO TABS
4.0000 mg | ORAL_TABLET | Freq: Once | ORAL | Status: AC
  Administered 2016-06-15: 4 mg via ORAL
  Filled 2016-06-15: qty 1

## 2016-06-15 MED ORDER — HYDROCODONE-ACETAMINOPHEN 5-325 MG PO TABS
2.0000 | ORAL_TABLET | Freq: Once | ORAL | Status: AC
  Administered 2016-06-15: 2 via ORAL
  Filled 2016-06-15: qty 2

## 2016-06-15 MED ORDER — HYDROCODONE-ACETAMINOPHEN 5-325 MG PO TABS: 1.0000 | ORAL_TABLET | ORAL | 0 refills | Status: DC | PRN

## 2016-06-15 MED ORDER — CEFTRIAXONE SODIUM 1 G IJ SOLR
1.0000 g | Freq: Once | INTRAMUSCULAR | Status: AC
  Administered 2016-06-15: 1 g via INTRAMUSCULAR
  Filled 2016-06-15: qty 10

## 2016-06-15 MED ORDER — DOXYCYCLINE HYCLATE 100 MG PO CAPS: 100.0000 mg | ORAL_CAPSULE | Freq: Two times a day (BID) | ORAL | 0 refills | Status: DC

## 2016-06-15 MED ORDER — DOXYCYCLINE HYCLATE 100 MG PO TABS
100.0000 mg | ORAL_TABLET | Freq: Once | ORAL | Status: AC
  Administered 2016-06-15: 100 mg via ORAL
  Filled 2016-06-15: qty 1

## 2016-06-15 NOTE — ED Provider Notes (Signed)
[facility] DEPT Provider Note   CSN: [id number] Arrival date & time: 06/15/16  1947  First Provider Contact:  First MD Initiated Contact with Patient 06/15/16 2005        History   Chief Complaint Chief Complaint  Patient presents with  . Insect Bite    HPI Hayden Villa is a 40 y.o. male.  Patient is a 40 year old male who presents to the emergency department with a complaint of blister and swelling of the third toe of the left foot.  The patient states that he was cleaning out his [employer] boots a few days ago. He left them outside onto dry. He put his shoes on without shaking them were testing them. He thought he felt something bite or sting him. By the time he got home from work that day he noticed a very small bump on on the top of his toe. Last night the blood the ball paternity until blister, and today he has a blister on the toe, pain behind the toe and on the ball of his foot. He denies any recent fever or chills. He's not had any recent procedures or trauma to the left foot.      Past Medical History:  Diagnosis Date  . Asthma   . Hypertension     There are no active problems to display for this patient.   History reviewed. No pertinent surgical history.     Home Medications    Prior to Admission medications   Medication Sig Start Date End Date Taking? Authorizing Provider  albuterol (VENTOLIN HFA) 108 (90 BASE) MCG/ACT inhaler Inhale 2 puffs into the lungs every 6 (six) hours as needed.    Historical Provider, MD  doxycycline (VIBRAMYCIN) 100 MG capsule Take 1 capsule (100 mg total) by mouth 2 (two) times daily. 09/07/12   Annamarie Dawley, MD  ibuprofen (ADVIL,MOTRIN) 800 MG tablet Take 1 tablet (800 mg total) by mouth 3 (three) times daily. 09/07/12   Annamarie Dawley, MD  oxyCODONE-acetaminophen (PERCOCET) 7.5-325 MG tablet Take 1 tablet by mouth every 4 (four) hours as needed for moderate pain or severe pain (Must last 30 days.Do not take and drive a car  or operate machinery). 06/07/16   Darreld Mclean, MD  UNKNOWN TO PATIENT Take 1 tablet by mouth daily. For BP Medication    Historical Provider, MD    Family History History reviewed. No pertinent family history.  Social History Social History  Substance Use Topics  . Smoking status: Current Every Day Smoker    Packs/day: 1.50    Types: Cigarettes  . Smokeless tobacco: Never Used  . Alcohol use Yes     Comment: rarely     Allergies   Review of patient's allergies indicates no known allergies.   Review of Systems Review of Systems  Constitutional: Negative for activity change, chills and fever.       All ROS Neg except as noted in HPI  HENT: Negative for nosebleeds.   Eyes: Negative for photophobia and discharge.  Respiratory: Negative for cough, shortness of breath and wheezing.   Cardiovascular: Negative for chest pain and palpitations.  Gastrointestinal: Negative for abdominal pain and blood in stool.  Genitourinary: Negative for dysuria, frequency and hematuria.  Musculoskeletal: Negative for arthralgias, back pain and neck pain.       Foot pain  Skin: Negative.   Neurological: Negative for dizziness, seizures and speech difficulty.  Psychiatric/Behavioral: Negative for confusion and hallucinations.     Physical Exam  Updated Vital Signs BP 141/83 (BP Location: Left Arm)   Pulse 99   Temp 98.9 F (37.2 C) (Oral)   Resp 16   Ht 6\' 2"  (1.88 m)   Wt (!) 139.7 kg   SpO2 98%   BMI 39.54 kg/m   Physical Exam  Constitutional: He is oriented to person, place, and time. He appears well-developed and well-nourished.  Non-toxic appearance.  HENT:  Head: Normocephalic.  Right Ear: Tympanic membrane and external ear normal.  Left Ear: Tympanic membrane and external ear normal.  Eyes: EOM and lids are normal. Pupils are equal, round, and reactive to light.  Neck: Normal range of motion. Neck supple. Carotid bruit is not present.  Cardiovascular: Normal rate, regular  rhythm, normal heart sounds, intact distal pulses and normal pulses.   Pulmonary/Chest: Breath sounds normal. No respiratory distress.  Abdominal: Soft. Bowel sounds are normal. There is no tenderness. There is no guarding.  Musculoskeletal: Normal range of motion.       Left foot: There is tenderness and swelling. There is no deformity.       Feet:  Lymphadenopathy:       Head (right side): No submandibular adenopathy present.       Head (left side): No submandibular adenopathy present.    He has no cervical adenopathy.  Neurological: He is alert and oriented to person, place, and time. He has normal strength. No cranial nerve deficit or sensory deficit.  Skin: Skin is warm and dry.  Psychiatric: He has a normal mood and affect. His speech is normal.  Nursing note and vitals reviewed.    ED Treatments / Results  Labs (all labs ordered are listed, but only abnormal results are displayed) Labs Reviewed - No data to display  EKG  EKG Interpretation None       Radiology No results found.  Procedures Procedures (including critical care time)  Medications Ordered in ED Medications - No data to display   Initial Impression / Assessment and Plan / ED Course  I have reviewed the triage vital signs and the nursing notes.  Pertinent labs & imaging results that were available during my care of the patient were reviewed by me and considered in my medical decision making (see chart for details).  Clinical Course    **I have reviewed nursing notes, vital signs, and all appropriate lab and imaging results for this patient.*  Final Clinical Impressions(s) / ED Diagnoses  Vital signs within normal limits.  There is a blister and some swelling of the toe on the left foot. Patient will be treated with Rocephin here in the emergency department. Prescription for doxycycline given to the patient. The patient is to elevate his foot. He will be rechecked in 48-72 hours concerning his  foot.    Final diagnoses:  None    New Prescriptions New Prescriptions   No medications on file     Ivery QualeHobson Amorita Vanrossum, Cordelia Poche-C 06/15/16 2056    Eber HongBrian Miller, MD 06/16/16 1323

## 2016-06-15 NOTE — ED Triage Notes (Signed)
Patient states he thinks he was bitten by a spider on his left 3rd toe yesterday. Patient has blister noted on top of his left 3rd toe at triage.

## 2016-06-15 NOTE — Discharge Instructions (Signed)
Please keep your foot elevated, and use your postoperative shoe as well as your crutches and getting around. Please use doxycycline 2 times daily. Use 600 mg of ibuprofen with breakfast, lunch, dinner, and bedtime for swelling and inflammation. Use Norco for pain if needed. Norco may cause drowsiness, please use this medication with caution. Please return to the emergency department for recheck of your toe on Sunday, August 13 after 4:00 PM.

## 2016-06-16 DIAGNOSIS — I10 Essential (primary) hypertension: Secondary | ICD-10-CM | POA: Diagnosis not present

## 2016-06-16 DIAGNOSIS — F17200 Nicotine dependence, unspecified, uncomplicated: Secondary | ICD-10-CM | POA: Diagnosis not present

## 2016-06-16 DIAGNOSIS — J439 Emphysema, unspecified: Secondary | ICD-10-CM | POA: Diagnosis not present

## 2016-06-16 DIAGNOSIS — J45909 Unspecified asthma, uncomplicated: Secondary | ICD-10-CM | POA: Diagnosis not present

## 2016-06-16 DIAGNOSIS — M7989 Other specified soft tissue disorders: Secondary | ICD-10-CM | POA: Diagnosis not present

## 2016-06-16 DIAGNOSIS — L03116 Cellulitis of left lower limb: Secondary | ICD-10-CM | POA: Diagnosis not present

## 2016-06-18 ENCOUNTER — Encounter (HOSPITAL_COMMUNITY): Payer: Self-pay | Admitting: *Deleted

## 2016-06-18 ENCOUNTER — Observation Stay (HOSPITAL_COMMUNITY)
Admission: EM | Admit: 2016-06-18 | Discharge: 2016-06-20 | Disposition: A | Discharge status: Home / Self Care | Payer: Medicare Other | Attending: Internal Medicine | Admitting: Internal Medicine

## 2016-06-18 DIAGNOSIS — J4540 Moderate persistent asthma, uncomplicated: Secondary | ICD-10-CM | POA: Diagnosis present

## 2016-06-18 DIAGNOSIS — Y929 Unspecified place or not applicable: Secondary | ICD-10-CM | POA: Insufficient documentation

## 2016-06-18 DIAGNOSIS — Z79891 Long term (current) use of opiate analgesic: Secondary | ICD-10-CM | POA: Diagnosis not present

## 2016-06-18 DIAGNOSIS — Y999 Unspecified external cause status: Secondary | ICD-10-CM | POA: Diagnosis not present

## 2016-06-18 DIAGNOSIS — L03116 Cellulitis of left lower limb: Secondary | ICD-10-CM | POA: Diagnosis not present

## 2016-06-18 DIAGNOSIS — F17210 Nicotine dependence, cigarettes, uncomplicated: Secondary | ICD-10-CM | POA: Insufficient documentation

## 2016-06-18 DIAGNOSIS — Y939 Activity, unspecified: Secondary | ICD-10-CM | POA: Insufficient documentation

## 2016-06-18 DIAGNOSIS — Z79899 Other long term (current) drug therapy: Secondary | ICD-10-CM | POA: Insufficient documentation

## 2016-06-18 DIAGNOSIS — Z720 Tobacco use: Secondary | ICD-10-CM | POA: Diagnosis present

## 2016-06-18 DIAGNOSIS — W57XXXA Bitten or stung by nonvenomous insect and other nonvenomous arthropods, initial encounter: Secondary | ICD-10-CM | POA: Insufficient documentation

## 2016-06-18 DIAGNOSIS — I10 Essential (primary) hypertension: Secondary | ICD-10-CM | POA: Insufficient documentation

## 2016-06-18 DIAGNOSIS — S90862A Insect bite (nonvenomous), left foot, initial encounter: Secondary | ICD-10-CM | POA: Diagnosis present

## 2016-06-18 DIAGNOSIS — J45909 Unspecified asthma, uncomplicated: Secondary | ICD-10-CM | POA: Insufficient documentation

## 2016-06-18 DIAGNOSIS — L03119 Cellulitis of unspecified part of limb: Secondary | ICD-10-CM

## 2016-06-18 DIAGNOSIS — L02619 Cutaneous abscess of unspecified foot: Secondary | ICD-10-CM | POA: Diagnosis present

## 2016-06-18 LAB — CBC WITH DIFFERENTIAL/PLATELET
BASOS PCT: 0 %
Basophils Absolute: 0 10*3/uL (ref 0.0–0.1)
EOS ABS: 0.1 10*3/uL (ref 0.0–0.7)
EOS PCT: 1 %
HEMATOCRIT: 42.4 % (ref 39.0–52.0)
Hemoglobin: 14 g/dL (ref 13.0–17.0)
Lymphocytes Relative: 41 %
Lymphs Abs: 3.7 10*3/uL (ref 0.7–4.0)
MCH: 29.4 pg (ref 26.0–34.0)
MCHC: 33 g/dL (ref 30.0–36.0)
MCV: 89.1 fL (ref 78.0–100.0)
MONO ABS: 0.9 10*3/uL (ref 0.1–1.0)
MONOS PCT: 10 %
Neutro Abs: 4.4 10*3/uL (ref 1.7–7.7)
Neutrophils Relative %: 48 %
Platelets: 227 10*3/uL (ref 150–400)
RBC: 4.76 MIL/uL (ref 4.22–5.81)
RDW: 14 % (ref 11.5–15.5)
WBC: 9.1 10*3/uL (ref 4.0–10.5)

## 2016-06-18 LAB — BASIC METABOLIC PANEL
Anion gap: 5 (ref 5–15)
BUN: 17 mg/dL (ref 6–20)
CALCIUM: 8.5 mg/dL — AB (ref 8.9–10.3)
CO2: 30 mmol/L (ref 22–32)
CREATININE: 1.09 mg/dL (ref 0.61–1.24)
Chloride: 104 mmol/L (ref 101–111)
GFR calc non Af Amer: >60 mL/min (ref >60)
GLUCOSE: 95 mg/dL (ref 65–99)
Potassium: 3.8 mmol/L (ref 3.5–5.1)
SODIUM: 139 mmol/L (ref 135–145)

## 2016-06-18 MED ORDER — VANCOMYCIN HCL 10 G IV SOLR
1250.0000 mg | Freq: Three times a day (TID) | INTRAVENOUS | Status: DC
  Administered 2016-06-19 – 2016-06-20 (×4): 1250 mg via INTRAVENOUS
  Filled 2016-06-18 (×8): qty 1250

## 2016-06-18 MED ORDER — SODIUM CHLORIDE 0.9 % IV BOLUS (SEPSIS)
500.0000 mL | Freq: Once | INTRAVENOUS | Status: AC
  Administered 2016-06-18: 500 mL via INTRAVENOUS

## 2016-06-18 MED ORDER — VANCOMYCIN HCL IN DEXTROSE 1-5 GM/200ML-% IV SOLN
2000.0000 mg | Freq: Once | INTRAVENOUS | Status: AC
Start: 2016-06-18 — End: 2016-06-18
  Administered 2016-06-18: 2000 mg via INTRAVENOUS
  Filled 2016-06-18: qty 400

## 2016-06-18 NOTE — Progress Notes (Addendum)
Pharmacy Antibiotic Note  Hayden Villa is a 40 y.o. male admitted on 06/18/2016 with cellulitis -- gradually worsening inset bite on L foot 4 days PTA.  [facility], prescribed abx which he states he was taking, then [facility] and reports continued pain, swelling and redness.    Pharmacy has been consulted for vancomycin dosing.  Plan: - Vancomycin 2 g IV x 1 now - Vancomycin 1250 mg IV q8h - Check vancomycin trough at [facility] - Follow up SCr, UOP, cultures, clinical course and adjust as clinically indicated. - also adjusted lovenox to 0.5 mg/kg sq q24h for dvt pxl -- 70 mg sq q24h     Temp (24hrs), Avg:98.7 F (37.1 C), Min:98.7 F (37.1 C), Max:98.7 F (37.1 C)  No results for input(s): WBC, CREATININE, LATICACIDVEN, VANCOTROUGH, VANCOPEAK, VANCORANDOM, GENTTROUGH, GENTPEAK, GENTRANDOM, TOBRATROUGH, TOBRAPEAK, TOBRARND, AMIKACINPEAK, AMIKACINTROU, AMIKACIN in the last 168 hours.  CrCl cannot be calculated (Patient's most recent lab result is older than the maximum 21 days allowed.).    No Known Allergies  Antimicrobials this admission: Vancomycin 8/13/ >>    Dose adjustments this admission:   Microbiology results:  BCx:   UCx:    Sputum:    MRSA PCR:   Thank you for allowing pharmacy to be a part of this patient's care.  Drusilla KannerGrimsley, Ebbie Sorenson Lydia 06/18/2016 10:18 PM

## 2016-06-18 NOTE — ED Provider Notes (Signed)
[facility] Provider Note   CSN: [id number] Arrival date & time: 06/18/16  2132  First Provider Contact:  First MD Initiated Contact with Patient 06/18/16 2154     By signing my name below, I, Hayden Villa, attest that this documentation has been prepared under the direction and in the presence of Hayden HutchingBrian Nishika Parkhurst, MD. Electronically signed, Hayden Villa, ED Scribe. 06/18/16. 10:10 PM.   History   Chief Complaint Chief Complaint  Patient presents with  . Insect Bite    HPI HPI Comments: Hayden Villa is a 40 y.o. male who presents to the Emergency Department complaining of gradually worsening insect bite to the dorsal aspect of his left foot; onset four days ago. Pt states that he thinks he was bitten by a spider on his left third toe. Pt states he saw the spider in his shoe after being bit. Pt was seen here in the ED on 06/15/2016 for the same complaint. Pt was discharged with abx and "has been taking them as prescribed". Pt also states [facility] was [facility] and still complains of gradually worsening pain, swelling and redness to the area.   The history is provided by the patient. No language interpreter was used.    Past Medical History:  Diagnosis Date  . Asthma   . Hypertension     Patient Active Problem List   Diagnosis Date Noted  . Cellulitis of left foot 06/18/2016    History reviewed. No pertinent surgical history.     Home Medications    Prior to Admission medications   Medication Sig Start Date End Date Taking? Authorizing Provider  albuterol (VENTOLIN HFA) 108 (90 BASE) MCG/ACT inhaler Inhale 2 puffs into the lungs every 6 (six) hours as needed for wheezing or shortness of breath.    Yes Historical Provider, MD  doxycycline (VIBRAMYCIN) 100 MG capsule Take 1 capsule (100 mg total) by mouth 2 (two) times daily. 06/15/16  Yes Ivery QualeHobson Bryant, PA-C  HYDROcodone-acetaminophen (NORCO/VICODIN) 5-325 MG tablet Take 1 tablet by mouth every 4 (four)  hours as needed for moderate pain.   Yes Historical Provider, MD  labetalol (NORMODYNE) 100 MG tablet Take 100 mg by mouth 2 (two) times daily.   Yes Historical Provider, MD  oxyCODONE-acetaminophen (PERCOCET) 7.5-325 MG tablet Take 1 tablet by mouth every 4 (four) hours as needed for severe pain.   Yes Historical Provider, MD    Family History No family history on file.  Social History Social History  Substance Use Topics  . Smoking status: Current Every Day Smoker    Packs/day: 1.50    Types: Cigarettes  . Smokeless tobacco: Never Used  . Alcohol use Yes     Comment: rarely     Allergies   Review of patient's allergies indicates no known allergies.   Review of Systems Review of Systems  Skin: Positive for color change (red) and wound (insect bite with pain and swelling; blister to dorsal aspect of left foot).  All other systems reviewed and are negative.    Physical Exam Updated Vital Signs BP 134/80   Pulse 94   Temp 98.7 F (37.1 C) (Oral)   Resp 22   SpO2 97%   Physical Exam  Constitutional: He appears well-developed and well-nourished.  HENT:  Head: Normocephalic and atraumatic.  Eyes: Conjunctivae are normal.  Neck: Neck supple.  Cardiovascular: Normal rate and regular rhythm.   No murmur heard. Pulmonary/Chest: Effort normal and breath sounds normal. No  respiratory distress.  Abdominal: Soft. There is no tenderness.  Musculoskeletal: He exhibits no edema.  Neurological: He is alert.  Skin: Skin is warm and dry. There is erythema.  1.5cm blister on the dorsum of the third mtp joint Left foot erythema and edema on dorsum of foot distally and laterally   Psychiatric: He has a normal mood and affect.  Nursing note and vitals reviewed.    ED Treatments / Results  DIAGNOSTIC STUDIES: Oxygen Saturation is 97% on RA, normal by my interpretation.  COORDINATION OF CARE: 9:58 PM-Will order abx. Discussed treatment plan with pt at bedside and pt agreed to  plan.   Labs (all labs ordered are listed, but only abnormal results are displayed) Labs Reviewed  BASIC METABOLIC PANEL - Abnormal; Notable for the following:       Result Value   Calcium 8.5 (*)    All other components within normal limits  CBC WITH DIFFERENTIAL/PLATELET    EKG  EKG Interpretation None       Radiology No results found.  Procedures Procedures (including critical care time)  Medications Ordered in ED Medications  vancomycin (VANCOCIN) IVPB 1000 mg/200 mL premix (2,000 mg Intravenous New Bag/Given 06/18/16 2220)  sodium chloride 0.9 % bolus 500 mL (500 mLs Intravenous New Bag/Given 06/18/16 2214)     Initial Impression / Assessment and Plan / ED Course  I have reviewed the triage vital signs and the nursing notes.  Pertinent labs & imaging results that were available during my care of the patient were reviewed by me and considered in my medical decision making (see chart for details).  Clinical Course    Patient has obvious cellulitis of the left foot. He has failed outpatient oral antibiotic treatment. Glucose is normal. IV vancomycin. Admit to observation  Final Clinical Impressions(s) / ED Diagnoses   Final diagnoses:  Cellulitis of left foot    New Prescriptions New Prescriptions   No medications on file  I personally performed the services described in this documentation, which was scribed in my presence. The recorded information has been reviewed and is accurate.      Hayden Hutching, MD 06/18/16 2258

## 2016-06-18 NOTE — ED Triage Notes (Signed)
Pt c/o insect bite to left foot on 06/15/2016, [facility], [facility], still continues to have redness, swelling and pain to left foot area,

## 2016-06-19 ENCOUNTER — Encounter (HOSPITAL_COMMUNITY): Payer: Self-pay | Admitting: Family Medicine

## 2016-06-19 DIAGNOSIS — L03119 Cellulitis of unspecified part of limb: Secondary | ICD-10-CM

## 2016-06-19 DIAGNOSIS — J4540 Moderate persistent asthma, uncomplicated: Secondary | ICD-10-CM | POA: Diagnosis not present

## 2016-06-19 DIAGNOSIS — L02619 Cutaneous abscess of unspecified foot: Secondary | ICD-10-CM

## 2016-06-19 DIAGNOSIS — I10 Essential (primary) hypertension: Secondary | ICD-10-CM

## 2016-06-19 DIAGNOSIS — Z720 Tobacco use: Secondary | ICD-10-CM

## 2016-06-19 DIAGNOSIS — L03116 Cellulitis of left lower limb: Secondary | ICD-10-CM

## 2016-06-19 LAB — SEDIMENTATION RATE: SED RATE: 5 mm/h (ref 0–16)

## 2016-06-19 LAB — LACTIC ACID, PLASMA
LACTIC ACID, VENOUS: 0.9 mmol/L (ref 0.5–1.9)
Lactic Acid, Venous: 1.1 mmol/L (ref 0.5–1.9)

## 2016-06-19 LAB — APTT: APTT: 27 s (ref 24–36)

## 2016-06-19 LAB — PROTIME-INR
INR: 0.98
Prothrombin Time: 13 seconds (ref 11.4–15.2)

## 2016-06-19 LAB — GLUCOSE, CAPILLARY: Glucose-Capillary: 139 mg/dL — ABNORMAL HIGH (ref 65–99)

## 2016-06-19 LAB — C-REACTIVE PROTEIN: CRP: 1.9 mg/dL — ABNORMAL HIGH (ref <1.0)

## 2016-06-19 MED ORDER — BISACODYL 5 MG PO TBEC
5.0000 mg | DELAYED_RELEASE_TABLET | Freq: Every day | ORAL | Status: DC | PRN
Start: 2016-06-19 — End: 2016-06-20

## 2016-06-19 MED ORDER — NICOTINE 21 MG/24HR TD PT24
21.0000 mg | MEDICATED_PATCH | Freq: Every day | TRANSDERMAL | Status: DC
  Administered 2016-06-19 – 2016-06-20 (×2): 21 mg via TRANSDERMAL
  Filled 2016-06-19 (×2): qty 1

## 2016-06-19 MED ORDER — ALBUTEROL SULFATE (2.5 MG/3ML) 0.083% IN NEBU
2.5000 mg | INHALATION_SOLUTION | RESPIRATORY_TRACT | Status: DC | PRN
  Administered 2016-06-19: 5 mg via RESPIRATORY_TRACT
  Administered 2016-06-19: 2.5 mg via RESPIRATORY_TRACT
  Filled 2016-06-19 (×3): qty 3

## 2016-06-19 MED ORDER — OXYCODONE-ACETAMINOPHEN 7.5-325 MG PO TABS
1.0000 | ORAL_TABLET | ORAL | Status: DC | PRN
  Administered 2016-06-19 (×2): 2 via ORAL
  Administered 2016-06-19: 1 via ORAL
  Administered 2016-06-20 (×3): 2 via ORAL
  Filled 2016-06-19: qty 2
  Filled 2016-06-19: qty 1
  Filled 2016-06-19 (×4): qty 2

## 2016-06-19 MED ORDER — LABETALOL HCL 200 MG PO TABS
100.0000 mg | ORAL_TABLET | Freq: Two times a day (BID) | ORAL | Status: DC
  Administered 2016-06-20: 100 mg via ORAL
  Filled 2016-06-19: qty 1

## 2016-06-19 MED ORDER — ONDANSETRON HCL 4 MG PO TABS: 4.0000 mg | ORAL_TABLET | Freq: Four times a day (QID) | ORAL | Status: DC | PRN

## 2016-06-19 MED ORDER — ONDANSETRON HCL 4 MG/2ML IJ SOLN: 4.0000 mg | Freq: Four times a day (QID) | INTRAMUSCULAR | Status: DC | PRN

## 2016-06-19 MED ORDER — ENOXAPARIN SODIUM 80 MG/0.8ML ~~LOC~~ SOLN
70.0000 mg | Freq: Every day | SUBCUTANEOUS | Status: DC
  Administered 2016-06-19: 70 mg via SUBCUTANEOUS
  Filled 2016-06-19: qty 0.8

## 2016-06-19 MED ORDER — POLYETHYLENE GLYCOL 3350 17 G PO PACK: 17.0000 g | PACK | Freq: Every day | ORAL | Status: DC | PRN

## 2016-06-19 NOTE — Progress Notes (Signed)
Patient seen and examined. Databsse reviewed. Admitted earlier today with LLE cellulitis following a spider bite. Plbi.  Plan to continue abx today and assess cellulitis in an to determine further treatment. Will continue to follow.  Peggye PittEstela Hernandez, MD [facility] Pager: [phone]

## 2016-06-19 NOTE — Care Management Obs Status (Signed)
MEDICARE OBSERVATION STATUS NOTIFICATION   Patient Details  Name: Hayden Villa MRN: [id number] Date of Birth: 26-Jun-1976   Medicare Observation Status Notification Given:  Yes    Malcolm MetroChildress, Calliope Delangel Demske, RN 06/19/2016, 3:06 PM

## 2016-06-19 NOTE — H&P (Signed)
History and Physical    Hayden Villa ZOX:[id number] DOB: Hayden Bride24-Aug-1977 DOA: 06/18/2016  PCP: Macky LowerSKILLMAN,KATIE, PA-C   Patient coming from: Home  Chief Complaint: Left foot redness, pain, swelling  HPI: Hayden Bridedwin T Hayden Villa is a 40 y.o. male with medical history significant for hypertension and asthma who presents the emergency department for evaluation of redness, swelling, and pain at the left forefoot. Patient reports being in his usual state of good health until 06/13/2016 when he felt a "pinch" on the dorsal surface of his left foot while putting his shoes on. Patient assumed this was a spider bite, though he never saw an insect. He saw a small bump that night at the base of the third digit on the left foot, dorsal aspect. That site became erythematous, tender, and swollen over the course of the following day and he was evaluated here in the emergency department on 06/15/2016. By that time, a large blister that developed at the site where an small bump was initially seen. He was afebrile at that time and there was no leukocytosis; he was discharged home with doxycycline. Patient reports strict adherence with his antibiotic, but states that the swelling, erythema, and pain have all continued to increase. He denies fevers, chills, lightheadedness, chest pain, or palpitations. There has been no headaches, change in vision or hearing, confusion, or focal numbness or weakness.  ED Course: Upon arrival to the ED, patient is found to be afebrile, saturating well on room air, and with vital signs otherwise stable. Chemistry panel is unremarkable and CBC is entirely within the normal limits. Patient was given a 500 mL bolus of normal saline in the emergency department and started on empiric vancomycin for purulent cellulitis that has failed outpatient therapy. Patient remained hemodynamically stable in the ED and began to develop a low-grade temperature. He will be observed on the medical-surgical unit for ongoing  evaluation and management of purulent cellulitis at the left forefoot.  Review of Systems:  All other systems reviewed and apart from HPI, are negative.  Past Medical History:  Diagnosis Date  . Asthma   . Hypertension     History reviewed. No pertinent surgical history.   reports that he has been smoking Cigarettes.  He has been smoking about 1.50 packs per day. He has never used smokeless tobacco. He reports that he drinks alcohol. He reports that he uses drugs, including Marijuana.  No Known Allergies  History reviewed. No pertinent family history.   Prior to Admission medications   Medication Sig Start Date End Date Taking? Authorizing Provider  albuterol (VENTOLIN HFA) 108 (90 BASE) MCG/ACT inhaler Inhale 2 puffs into the lungs every 6 (six) hours as needed for wheezing or shortness of breath.    Yes Historical Provider, MD  doxycycline (VIBRAMYCIN) 100 MG capsule Take 1 capsule (100 mg total) by mouth 2 (two) times daily. 06/15/16  Yes Ivery QualeHobson Bryant, PA-C  HYDROcodone-acetaminophen (NORCO/VICODIN) 5-325 MG tablet Take 1 tablet by mouth every 4 (four) hours as needed for moderate pain.   Yes Historical Provider, MD  labetalol (NORMODYNE) 100 MG tablet Take 100 mg by mouth 2 (two) times daily.   Yes Historical Provider, MD  oxyCODONE-acetaminophen (PERCOCET) 7.5-325 MG tablet Take 1 tablet by mouth every 4 (four) hours as needed for severe pain.   Yes Historical Provider, MD    Physical Exam: Vitals:   06/18/16 2330 06/18/16 2345 06/19/16 0000 06/19/16 0016  BP:      Pulse: 88 90 89   Resp:  Temp:    99.2 F (37.3 C)  TempSrc:    Oral  SpO2: 99% 97% 97%   Height:    6\' 2"  (1.88 m)      Constitutional: NAD, calm, comfortable. Obese. Eyes: PERTLA, lids and conjunctivae normal ENMT: Mucous membranes are moist. Posterior pharynx clear of any exudate or lesions.   Neck: normal, supple, no masses, no thyromegaly Respiratory: Expiratory wheezes throughout. Normal  respiratory effort. No accessory muscle use.  Cardiovascular: S1 & S2 heard, regular rate and rhythm. No extremity edema. 2+ pedal pulses. No significant JVD. Abdomen: No distension, no tenderness, no masses palpated. Bowel sounds normal.  Musculoskeletal: no clubbing / cyanosis. No joint deformity upper and lower extremities. Lt foot findings below.  Skin: Left dorsal forefoot with erythema and edema extending from the base of the 3rd digit to the lateral aspect of the foot with a ~2.5 cm tense bullae at the base of 3rd digit on dorsal side. Otherwise, skin is warm, dry, well-perfused. Neurologic: CN 2-12 grossly intact. Sensation intact, DTR normal. Strength 5/5 in all 4 limbs.  Psychiatric: Normal judgment and insight. Alert and oriented x 3. Normal mood and affect.     Labs on Admission: I have personally reviewed following labs and imaging studies  CBC:  Recent Labs Lab 06/18/16 2205  WBC 9.1  NEUTROABS 4.4  HGB 14.0  HCT 42.4  MCV 89.1  PLT 227   Basic Metabolic Panel:  Recent Labs Lab 06/18/16 2205  NA 139  K 3.8  CL 104  CO2 30  GLUCOSE 95  BUN 17  CREATININE 1.09  CALCIUM 8.5*   GFR: Estimated Creatinine Clearance: 134 mL/min (by C-G formula based on SCr of 1.09 mg/dL). Liver Function Tests: No results for input(s): AST, ALT, ALKPHOS, BILITOT, PROT, ALBUMIN in the last 168 hours. No results for input(s): LIPASE, AMYLASE in the last 168 hours. No results for input(s): AMMONIA in the last 168 hours. Coagulation Profile: No results for input(s): INR, PROTIME in the last 168 hours. Cardiac Enzymes: No results for input(s): CKTOTAL, CKMB, CKMBINDEX, TROPONINI in the last 168 hours. BNP (last 3 results) No results for input(s): PROBNP in the last 8760 hours. HbA1C: No results for input(s): HGBA1C in the last 72 hours. CBG: No results for input(s): GLUCAP in the last 168 hours. Lipid Profile: No results for input(s): CHOL, HDL, LDLCALC, TRIG, CHOLHDL,  LDLDIRECT in the last 72 hours. Thyroid Function Tests: No results for input(s): TSH, T4TOTAL, FREET4, T3FREE, THYROIDAB in the last 72 hours. Anemia Panel: No results for input(s): VITAMINB12, FOLATE, FERRITIN, TIBC, IRON, RETICCTPCT in the last 72 hours. Urine analysis:    Component Value Date/Time   COLORURINE YELLOW 11/12/2012 0040   APPEARANCEUR CLEAR 11/12/2012 0040   LABSPEC 1.025 11/12/2012 0040   PHURINE 6.5 11/12/2012 0040   GLUCOSEU 100 (A) 11/12/2012 0040   HGBUR NEGATIVE 11/12/2012 0040   BILIRUBINUR NEGATIVE 11/12/2012 0040   KETONESUR NEGATIVE 11/12/2012 0040   PROTEINUR NEGATIVE 11/12/2012 0040   UROBILINOGEN 0.2 11/12/2012 0040   NITRITE NEGATIVE 11/12/2012 0040   LEUKOCYTESUR NEGATIVE 11/12/2012 0040   Sepsis Labs: @LABRCNTIP (procalcitonin:4,lacticidven:4) )No results found for this or any previous visit (from the past 240 hour(s)).   Radiological Exams on Admission: No results found.  EKG: Not performed, will obtain as appropriate.   Assessment/Plan  1. Cellulitis left foot  - No hx of DM and denies prior hx of skin/soft tissue infections  - Pt reports this started with an insect bite, though  he never saw an insect  - Low-suspicion for underlying osteo without leukocytosis or fevers  - Started on doxycycline on 06/15/2016 and reports strict adherence  - Condition has continued to worsen despite outpt tx with doxycycline  - Empiric vancomycin initiated in ED, will continue for now  - RN to outline erythema with skin pen   - Trend inflammatory markers and clinical progress  - If bullae doesn't rupture overnight, may need I&D   2. Asthma  - Moderate-persistent by history, though managed with albuterol prn only  - Wheezes on exam, but pt denies any dyspnea  - Albuterol neb treatments q4h prn  - May benefit from daily ICS, will defer to PCP    3. Hypertension  - At goal currently  - Continue current management with labetalol   4. Tobacco abuse -  Counseled toward cessation  - Nicotine patch provided  - RN asked to provide smoking cessation information prior to discharge     DVT prophylaxis: sq Lovenox  Code Status: Full  Family Communication: Discussed with patient  Disposition Plan: Observe on med-surg  Consults called: None Admission status: Observation    Briscoe Deutscher, MD [facility] Pager [phone]  If 7PM-7AM, please contact night-coverage [email or web address] Password [facility]  06/19/2016, 12:38 AM

## 2016-06-20 DIAGNOSIS — L03116 Cellulitis of left lower limb: Secondary | ICD-10-CM | POA: Diagnosis not present

## 2016-06-20 LAB — HIV ANTIBODY (ROUTINE TESTING W REFLEX): HIV Screen 4th Generation wRfx: NONREACTIVE

## 2016-06-20 LAB — GLUCOSE, CAPILLARY
GLUCOSE-CAPILLARY: 77 mg/dL (ref 65–99)
Glucose-Capillary: 81 mg/dL (ref 65–99)

## 2016-06-20 MED ORDER — HYDROCODONE-ACETAMINOPHEN 5-325 MG PO TABS: 1.0000 | ORAL_TABLET | ORAL | 0 refills | Status: DC | PRN

## 2016-06-20 MED ORDER — DIPHENHYDRAMINE HCL 25 MG PO CAPS
25.0000 mg | ORAL_CAPSULE | Freq: Once | ORAL | Status: AC
Start: 2016-06-20 — End: 2016-06-20
  Administered 2016-06-20: 25 mg via ORAL
  Filled 2016-06-20: qty 1

## 2016-06-20 MED ORDER — CLINDAMYCIN HCL 300 MG PO CAPS: 300.0000 mg | ORAL_CAPSULE | Freq: Four times a day (QID) | ORAL | 0 refills | Status: DC

## 2016-06-20 NOTE — Care Management Note (Signed)
Case Management Note  Patient Details  Name: Hayden Villa MRN: [id number] Date of Birth: Sep 03, 1976  Subjective/Objective:                  Pt is from home, ind with ADL's. Pt has PCP, transportation and insurance with drug coverage. Pt plans to return home with self care. Discharging today.   Action/Plan: No CM needs.   Expected Discharge Date:     06/20/2016             Expected Discharge Plan:  Home/Self Care  In-House Referral:  NA  Discharge planning Services  CM Consult  Post Acute Care Choice:  NA Choice offered to:  NA  DME Arranged:    DME Agency:     HH Arranged:    HH Agency:     Status of Service:  Completed, signed off  If discussed at [employer] Length of Stay Meetings, dates discussed:    Additional Comments:  Hayden Villa, Hayden Weier Demske, RN 06/20/2016, 12:33 PM

## 2016-06-20 NOTE — Progress Notes (Signed)
Patient with orders to be discharge home. Discharge instructions given, patient verbalized understanding. Prescriptions given. Patient stable. Patient left in private vehicle with friend.  

## 2016-06-20 NOTE — Discharge Summary (Signed)
Physician Discharge Summary  Hayden Villa [id number] DOB: [birth year] DOA: 06/18/2016  PCP: Macky LowerSKILLMAN,KATIE, PA-C  Admit date: 06/18/2016 Discharge date: 06/20/2016  Time spent: 45 minutes  Recommendations for Outpatient Follow-up:  -Will be discharged home today. -Will need follow up with PCP in 2 weeks.   Discharge Diagnoses:  Principal Problem:   Cellulitis of left foot Active Problems:   Asthma, moderate persistent   Essential hypertension   Tobacco abuse   Cellulitis and abscess of foot   Discharge Condition: Stable and improved  Filed Weights   06/19/16 0015  Weight: (!) 138.6 kg (305 lb 9.6 oz)    History of present illness:  As per Dr. Antionette Charpyd on 8/14: Hayden Villa is a 40 y.o. male with medical history significant for hypertension and asthma who presents the emergency department for evaluation of redness, swelling, and pain at the left forefoot. Patient reports being in his usual state of good health until 06/13/2016 when he felt a "pinch" on the dorsal surface of his left foot while putting his shoes on. Patient assumed this was a spider bite, though he never saw an insect. He saw a small bump that night at the base of the third digit on the left foot, dorsal aspect. That site became erythematous, tender, and swollen over the course of the following day and he was evaluated here in the emergency department on 06/15/2016. By that time, a large blister that developed at the site where an small bump was initially seen. He was afebrile at that time and there was no leukocytosis; he was discharged home with doxycycline. Patient reports strict adherence with his antibiotic, but states that the swelling, erythema, and pain have all continued to increase. He denies fevers, chills, lightheadedness, chest pain, or palpitations. There has been no headaches, change in vision or hearing, confusion, or focal numbness or weakness.  ED Course: Upon arrival to the ED, patient is  found to be afebrile, saturating well on room air, and with vital signs otherwise stable. Chemistry panel is unremarkable and CBC is entirely within the normal limits. Patient was given a 500 mL bolus of normal saline in the emergency department and started on empiric vancomycin for purulent cellulitis that has failed outpatient therapy. Patient remained hemodynamically stable in the ED and began to develop a low-grade temperature. He will be observed on the medical-surgical unit for ongoing evaluation and management of purulent cellulitis at the left forefoot.  Hospital Course:   Left Foot Cellulitis -Following a spider bite to the third toe. -Is much improved today. -Plan to DC home on clindamycin for 7 days. -See picture:   Rest of chronic medical conditions have been stable. OK for DC home today.  Procedures:  None   Consultations:  None  Discharge Instructions  Discharge Instructions    Diet - low sodium heart healthy    Complete by:  As directed   Increase activity slowly    Complete by:  As directed       Medication List    STOP taking these medications   doxycycline 100 MG capsule Commonly known as:  VIBRAMYCIN   oxyCODONE-acetaminophen 7.5-325 MG tablet Commonly known as:  PERCOCET     TAKE these medications   clindamycin 300 MG capsule Commonly known as:  CLEOCIN Take 1 capsule (300 mg total) by mouth 4 (four) times daily.   HYDROcodone-acetaminophen 5-325 MG tablet Commonly known as:  NORCO/VICODIN Take 1 tablet by mouth every 4 (four) hours as  needed for moderate pain.   labetalol 100 MG tablet Commonly known as:  NORMODYNE Take 100 mg by mouth 2 (two) times daily.   VENTOLIN HFA 108 (90 Base) MCG/ACT inhaler Generic drug:  albuterol Inhale 2 puffs into the lungs every 6 (six) hours as needed for wheezing or shortness of breath.      No Known Allergies Follow-up Information    SKILLMAN,KATIE, PA-C In 2 weeks.   Specialty:  Physician  Assistant Contact information: [address] [place] [id number]        Darreld McleanWayne Keeling, MD On 08/10/2016.   Specialty:  Orthopedic Surgery Why:  at  1:50 pm Contact information: [address] [place] [id number] [phone]            The results of significant diagnostics from this hospitalization (including imaging, microbiology, ancillary and laboratory) are listed below for reference.    Significant Diagnostic Studies: No results found.  Microbiology: No results found for this or any previous visit (from the past 240 hour(s)).   Labs: Basic Metabolic Panel:  Recent Labs Lab 06/18/16 2205  NA 139  K 3.8  CL 104  CO2 30  GLUCOSE 95  BUN 17  CREATININE 1.09  CALCIUM 8.5*   Liver Function Tests: No results for input(s): AST, ALT, ALKPHOS, BILITOT, PROT, ALBUMIN in the last 168 hours. No results for input(s): LIPASE, AMYLASE in the last 168 hours. No results for input(s): AMMONIA in the last 168 hours. CBC:  Recent Labs Lab 06/18/16 2205  WBC 9.1  NEUTROABS 4.4  HGB 14.0  HCT 42.4  MCV 89.1  PLT 227   Cardiac Enzymes: No results for input(s): CKTOTAL, CKMB, CKMBINDEX, TROPONINI in the last 168 hours. BNP: BNP (last 3 results) No results for input(s): BNP in the last 8760 hours.  ProBNP (last 3 results) No results for input(s): PROBNP in the last 8760 hours.  CBG:  Recent Labs Lab 06/19/16 0741 06/20/16 0756 06/20/16 1139  GLUCAP 139* 81 77       Signed:  HERNANDEZ ACOSTA,ESTELA  [facility] Pager: [phone] 06/20/2016, 2:08 PM

## 2016-06-28 ENCOUNTER — Ambulatory Visit: Payer: Medicare Other | Admitting: Orthopaedic Surgery

## 2016-07-04 ENCOUNTER — Telehealth: Payer: Self-pay | Admitting: Orthopaedic Surgery

## 2016-07-04 ENCOUNTER — Ambulatory Visit: Payer: Medicare Other | Admitting: Orthopaedic Surgery

## 2016-07-04 ENCOUNTER — Encounter: Payer: Self-pay | Admitting: Orthopaedic Surgery

## 2016-07-04 NOTE — Telephone Encounter (Signed)
Patient was a no show for his appointment today.  I called and left a message for him to call the office to reschedule this.  I have also sent a letter asking him to call and reschedule this appointment.

## 2016-07-06 ENCOUNTER — Ambulatory Visit (INDEPENDENT_AMBULATORY_CARE_PROVIDER_SITE_OTHER): Payer: Medicare Other | Admitting: Orthopaedic Surgery

## 2016-07-06 ENCOUNTER — Encounter: Payer: Self-pay | Admitting: Orthopaedic Surgery

## 2016-07-06 VITALS — BP 131/68 | HR 81 | Temp 97.7°F | Ht 73.0 in | Wt 301.0 lb

## 2016-07-06 DIAGNOSIS — F17200 Nicotine dependence, unspecified, uncomplicated: Secondary | ICD-10-CM

## 2016-07-06 DIAGNOSIS — M545 Low back pain, unspecified: Secondary | ICD-10-CM

## 2016-07-06 DIAGNOSIS — Z720 Tobacco use: Secondary | ICD-10-CM | POA: Diagnosis not present

## 2016-07-06 MED ORDER — OXYCODONE-ACETAMINOPHEN 7.5-325 MG PO TABS: 1.0000 | ORAL_TABLET | Freq: Four times a day (QID) | ORAL | 0 refills | Status: DC | PRN

## 2016-07-06 NOTE — Patient Instructions (Signed)
Smoking Cessation, Tips for Success If you are ready to quit smoking, congratulations! You have chosen to help yourself be healthier. Cigarettes bring nicotine, tar, carbon monoxide, and other irritants into your body. Your lungs, heart, and blood vessels will be able to work better without these poisons. There are many different ways to quit smoking. Nicotine gum, nicotine patches, a nicotine inhaler, or nicotine nasal spray can help with physical craving. Hypnosis, support groups, and medicines help break the habit of smoking. WHAT THINGS CAN I DO TO MAKE QUITTING EASIER?  Here are some tips to help you quit for good:  Pick a date when you will quit smoking completely. Tell all of your friends and family about your plan to quit on that date.  Do not try to slowly cut down on the number of cigarettes you are smoking. Pick a quit date and quit smoking completely starting on that day.  Throw away all cigarettes.   Clean and remove all ashtrays from your home, work, and car.  On a card, write down your reasons for quitting. Carry the card with you and read it when you get the urge to smoke.  Cleanse your body of nicotine. Drink enough water and fluids to keep your urine clear or pale yellow. Do this after quitting to flush the nicotine from your body.  Learn to predict your moods. Do not let a bad situation be your excuse to have a cigarette. Some situations in your life might tempt you into wanting a cigarette.  Never have "just one" cigarette. It leads to wanting another and another. Remind yourself of your decision to quit.  Change habits associated with smoking. If you smoked while driving or when feeling stressed, try other activities to replace smoking. Stand up when drinking your coffee. Brush your teeth after eating. Sit in a different chair when you read the paper. Avoid alcohol while trying to quit, and try to drink fewer caffeinated beverages. Alcohol and caffeine may urge you to  smoke.  Avoid foods and drinks that can trigger a desire to smoke, such as sugary or spicy foods and alcohol.  Ask people who smoke not to smoke around you.  Have something planned to do right after eating or having a cup of coffee. For example, plan to take a walk or exercise.  Try a relaxation exercise to calm you down and decrease your stress. Remember, you may be tense and nervous for the first 2 weeks after you quit, but this will pass.  Find new activities to keep your hands busy. Play with a pen, coin, or rubber band. Doodle or draw things on paper.  Brush your teeth right after eating. This will help cut down on the craving for the taste of tobacco after meals. You can also try mouthwash.   Use oral substitutes in place of cigarettes. Try using lemon drops, carrots, cinnamon sticks, or chewing gum. Keep them handy so they are available when you have the urge to smoke.  When you have the urge to smoke, try deep breathing.  Designate your home as a nonsmoking area.  If you are a heavy smoker, ask your health care provider about a prescription for nicotine chewing gum. It can ease your withdrawal from nicotine.  Reward yourself. Set aside the cigarette money you save and buy yourself something nice.  Look for support from others. Join a support group or smoking cessation program. Ask someone at home or at work to help you with your plan   to quit smoking.  Always ask yourself, "Do I need this cigarette or is this just a reflex?" Tell yourself, "Today, I choose not to smoke," or "I do not want to smoke." You are reminding yourself of your decision to quit.  Do not replace cigarette smoking with electronic cigarettes (commonly called e-cigarettes). The safety of e-cigarettes is unknown, and some may contain harmful chemicals.  If you relapse, do not give up! Plan ahead and think about what you will do the next time you get the urge to smoke. HOW WILL I FEEL WHEN I QUIT SMOKING? You  may have symptoms of withdrawal because your body is used to nicotine (the addictive substance in cigarettes). You may crave cigarettes, be irritable, feel very hungry, cough often, get headaches, or have difficulty concentrating. The withdrawal symptoms are only temporary. They are strongest when you first quit but will go away within 10-14 days. When withdrawal symptoms occur, stay in control. Think about your reasons for quitting. Remind yourself that these are signs that your body is healing and getting used to being without cigarettes. Remember that withdrawal symptoms are easier to treat than the major diseases that smoking can cause.  Even after the withdrawal is over, expect periodic urges to smoke. However, these cravings are generally short lived and will go away whether you smoke or not. Do not smoke! WHAT RESOURCES ARE AVAILABLE TO HELP ME QUIT SMOKING? Your health care provider can direct you to community resources or hospitals for support, which may include:  Group support.  Education.  Hypnosis.  Therapy.   This information is not intended to replace advice given to you by your health care provider. Make sure you discuss any questions you have with your health care provider.   Document Released: 07/21/2004 Document Revised: 11/13/2014 Document Reviewed: 04/10/2013 Elsevier Interactive Patient Education 2016 [employer]  

## 2016-07-06 NOTE — Progress Notes (Signed)
Patient [name], male DOB:[birth year], 40 y.o. AVW:[id number]  Chief Complaint  Patient presents with  . Foot Problem    CELLULITIS LEFT FOOT    HPI  Hayden Villa is a 40 y.o. male he has chronic lower back pain. It is stable. He has no paresthesias.  He has no new trauma.  He is doing his exercises and taking his medicine.  About two weeks ago he was bit by a spider and has had cellulitis of the left foot.  It is treated by another physician and will continue to be so. HPI  Body mass index is 39.71 kg/m.  ROS  Review of Systems  HENT: Negative for congestion.   Respiratory: Negative for cough and shortness of breath.   Cardiovascular: Negative for chest pain.  Endocrine: Positive for cold intolerance.  Musculoskeletal: Positive for arthralgias and back pain.  Allergic/Immunologic: Positive for environmental allergies.    Past Medical History:  Diagnosis Date  . Asthma   . Hypertension     Past Surgical History:  Procedure Laterality Date  . NO PAST SURGERIES      Family History  Problem Relation Age of Onset  . Hypertension Other     Social History Social History  Substance Use Topics  . Smoking status: Current Every Day Smoker    Packs/day: 1.50    Years: 25.00    Types: Cigarettes  . Smokeless tobacco: Never Used  . Alcohol use Yes     Comment: rarely    No Known Allergies  Current Outpatient Prescriptions  Medication Sig Dispense Refill  . albuterol (VENTOLIN HFA) 108 (90 BASE) MCG/ACT inhaler Inhale 2 puffs into the lungs every 6 (six) hours as needed for wheezing or shortness of breath.     . clindamycin (CLEOCIN) 300 MG capsule Take 1 capsule (300 mg total) by mouth 4 (four) times daily. 28 capsule 0  . labetalol (NORMODYNE) 100 MG tablet Take 100 mg by mouth 2 (two) times daily.    Marland Kitchen. oxyCODONE-acetaminophen (PERCOCET) 7.5-325 MG tablet Take 1 tablet by mouth every 6 (six) hours as needed for moderate pain or severe pain (Must last 14  days.Do not take and drive a car or operate machinery). 56 tablet 0   No current facility-administered medications for this visit.      Physical Exam  Blood pressure 131/68, pulse 81, temperature 97.7 F (36.5 C), height 6\' 1"  (1.854 m), weight (!) 301 lb (136.5 kg).  Constitutional: overall normal hygiene, normal nutrition, well developed, normal grooming, normal body habitus. Assistive device:none  Musculoskeletal: gait and station Limp none, muscle tone and strength are normal, no tremors or atrophy is present.  .  Neurological: coordination overall normal.  Deep tendon reflex/nerve stretch intact.  Sensation normal.  Cranial nerves II-XII intact.   Skin:   Resolving cellulitis of the left dorsal foot, no redness, otherwise overall no scars, lesions, ulcers or rashes. No psoriasis.  Psychiatric: Alert and oriented x 3.  Recent memory intact, remote memory unclear.  Normal mood and affect. Well groomed.  Good eye contact.  Cardiovascular: overall no swelling, no varicosities, no edema bilaterally, normal temperatures of the legs and arms, no clubbing, cyanosis and good capillary refill.  Lymphatic: palpation is normal.  Spine/Pelvis examination:  Inspection:  Overall, sacoiliac joint benign and hips nontender; without crepitus or defects.   Thoracic spine inspection: Alignment normal without kyphosis present   Lumbar spine inspection:  Alignment  with normal lumbar lordosis, without scoliosis apparent.  Thoracic spine palpation:  without tenderness of spinal processes   Lumbar spine palpation: with tenderness of lumbar area; without tightness of lumbar muscles    Range of Motion:   Lumbar flexion, forward flexion is 45 without pain or tenderness    Lumbar extension is full without pain or tenderness   Left lateral bend is Normal  without pain or tenderness   Right lateral bend is Normal without pain or tenderness   Straight leg raising is Normal   Strength & tone:  Normal   Stability overall normal stability     The patient has been educated about the nature of the problem(s) and counseled on treatment options.  The patient appeared to understand what I have discussed and is in agreement with it.  Encounter Diagnoses  Name Primary?  . Tobacco smoker within last 12 months Yes  . Midline low back pain without sciatica     PLAN Call if any problems.  Precautions discussed.  Continue current medications.   Return to clinic 3 months   Electronically Signed Darreld Mclean, MD 8/31/20173:36 PM

## 2016-07-18 ENCOUNTER — Telehealth: Payer: Self-pay | Admitting: Orthopaedic Surgery

## 2016-07-18 MED ORDER — OXYCODONE-ACETAMINOPHEN 7.5-325 MG PO TABS: 1.0000 | ORAL_TABLET | Freq: Four times a day (QID) | ORAL | 0 refills | Status: DC | PRN

## 2016-07-18 NOTE — Telephone Encounter (Signed)
Oxycodone-Acetaminophen 7.5/325mg  Qty  56 Tablets   Patient has Medicare/Medicaid

## 2016-07-19 ENCOUNTER — Encounter: Payer: Self-pay | Admitting: Orthopaedic Surgery

## 2016-08-10 ENCOUNTER — Ambulatory Visit: Payer: Medicare Other | Admitting: Orthopaedic Surgery

## 2016-08-15 ENCOUNTER — Telehealth: Payer: Self-pay | Admitting: Orthopaedic Surgery

## 2016-08-15 MED ORDER — OXYCODONE-ACETAMINOPHEN 7.5-325 MG PO TABS: 1.0000 | ORAL_TABLET | Freq: Four times a day (QID) | ORAL | 0 refills | Status: DC | PRN

## 2016-08-15 NOTE — Telephone Encounter (Signed)
Oxycodone-Acetaminophen  7.5/325 mg  Qty 110 Tablets °

## 2016-09-11 DIAGNOSIS — H00015 Hordeolum externum left lower eyelid: Secondary | ICD-10-CM | POA: Diagnosis not present

## 2016-09-11 DIAGNOSIS — F419 Anxiety disorder, unspecified: Secondary | ICD-10-CM | POA: Diagnosis not present

## 2016-09-12 ENCOUNTER — Telehealth: Payer: Self-pay | Admitting: Orthopaedic Surgery

## 2016-09-12 MED ORDER — OXYCODONE-ACETAMINOPHEN 7.5-325 MG PO TABS: 1.0000 | ORAL_TABLET | Freq: Four times a day (QID) | ORAL | 0 refills | Status: DC | PRN

## 2016-09-12 NOTE — Telephone Encounter (Signed)
Oxycodone-Acetaminophen 7.5/325mg   Qty 100 Tablets

## 2016-10-04 ENCOUNTER — Encounter: Payer: Self-pay | Admitting: Orthopaedic Surgery

## 2016-10-04 ENCOUNTER — Ambulatory Visit (INDEPENDENT_AMBULATORY_CARE_PROVIDER_SITE_OTHER): Payer: Medicare Other | Admitting: Orthopaedic Surgery

## 2016-10-04 VITALS — BP 126/79 | HR 85 | Temp 96.8°F | Ht 73.0 in | Wt 305.0 lb

## 2016-10-04 DIAGNOSIS — M545 Low back pain, unspecified: Secondary | ICD-10-CM

## 2016-10-04 DIAGNOSIS — F17200 Nicotine dependence, unspecified, uncomplicated: Secondary | ICD-10-CM | POA: Diagnosis not present

## 2016-10-04 DIAGNOSIS — G8929 Other chronic pain: Secondary | ICD-10-CM | POA: Diagnosis not present

## 2016-10-04 MED ORDER — OXYCODONE-ACETAMINOPHEN 7.5-325 MG PO TABS: 1.0000 | ORAL_TABLET | Freq: Four times a day (QID) | ORAL | 0 refills | Status: DC | PRN

## 2016-10-04 NOTE — Progress Notes (Signed)
Patient QM:VHQIO:Hayden Villa, male DOB:1976-02-14, 40 y.o. NGE:[id number]  Chief Complaint  Patient presents with  . Follow-up    back pain    HPI  Hayden Villa is a 40 y.o. male who has chronic pain of the lower back with no sciatica.  He has no acute episodes.  He has no paresthesias.  He has been doing his exercises.  He is also trying to cut back on smoking and is making some good progress in that.  He has no trauma. HPI  Body mass index is 40.24 kg/m.  ROS  Review of Systems  HENT: Negative for congestion.   Respiratory: Negative for cough and shortness of breath.   Cardiovascular: Negative for chest pain.  Endocrine: Positive for cold intolerance.  Musculoskeletal: Positive for arthralgias and back pain.  Allergic/Immunologic: Positive for environmental allergies.    Past Medical History:  Diagnosis Date  . Asthma   . Hypertension     Past Surgical History:  Procedure Laterality Date  . NO PAST SURGERIES      Family History  Problem Relation Age of Onset  . Hypertension Other     Social History Social History  Substance Use Topics  . Smoking status: Current Every Day Smoker    Packs/day: 1.50    Years: 25.00    Types: Cigarettes  . Smokeless tobacco: Never Used  . Alcohol use Yes     Comment: rarely    No Known Allergies  Current Outpatient Prescriptions  Medication Sig Dispense Refill  . albuterol (VENTOLIN HFA) 108 (90 BASE) MCG/ACT inhaler Inhale 2 puffs into the lungs every 6 (six) hours as needed for wheezing or shortness of breath.     . clindamycin (CLEOCIN) 300 MG capsule Take 1 capsule (300 mg total) by mouth 4 (four) times daily. 28 capsule 0  . labetalol (NORMODYNE) 100 MG tablet Take 100 mg by mouth 2 (two) times daily.    Marland Kitchen. oxyCODONE-acetaminophen (PERCOCET) 7.5-325 MG tablet Take 1 tablet by mouth every 6 (six) hours as needed for moderate pain or severe pain (Must last 30 days.Do not take and drive a car or operate machinery). 110  tablet 0   No current facility-administered medications for this visit.      Physical Exam  Blood pressure 126/79, pulse 85, temperature (!) 96.8 F (36 C), height 6\' 1"  (1.854 m), weight (!) 305 lb (138.3 kg).  Constitutional: overall normal hygiene, normal nutrition, well developed, normal grooming, normal body habitus. Assistive device:none  Musculoskeletal: gait and station Limp none, muscle tone and strength are normal, no tremors or atrophy is present.  .  Neurological: coordination overall normal.  Deep tendon reflex/nerve stretch intact.  Sensation normal.  Cranial nerves II-XII intact.   Skin:   Normal overall no scars, lesions, ulcers or rashes. No psoriasis.  Psychiatric: Alert and oriented x 3.  Recent memory intact, remote memory unclear.  Normal mood and affect. Well groomed.  Good eye contact.  Cardiovascular: overall no swelling, no varicosities, no edema bilaterally, normal temperatures of the legs and arms, no clubbing, cyanosis and good capillary refill.  Lymphatic: palpation is normal.  Spine/Pelvis examination:  Inspection:  Overall, sacoiliac joint benign and hips nontender; without crepitus or defects.   Thoracic spine inspection: Alignment normal without kyphosis present   Lumbar spine inspection:  Alignment  with normal lumbar lordosis, without scoliosis apparent.   Thoracic spine palpation:  without tenderness of spinal processes   Lumbar spine palpation: with tenderness of lumbar area;  without tightness of lumbar muscles    Range of Motion:   Lumbar flexion, forward flexion is 45 without pain or tenderness    Lumbar extension is 10 without pain or tenderness   Left lateral bend is Normal  without pain or tenderness   Right lateral bend is Normal without pain or tenderness   Straight leg raising is Normal   Strength & tone: Normal   Stability overall normal stability     The patient has been educated about the nature of the problem(s) and  counseled on treatment options.  The patient appeared to understand what I have discussed and is in agreement with it.  Encounter Diagnoses  Name Primary?  . Chronic midline low back pain without sciatica Yes  . Tobacco smoker within last 12 months     PLAN Call if any problems.  Precautions discussed.  Continue current medications.   Return to clinic 3 months   Electronically Signed Darreld McleanWayne Ralpheal Zappone, MD 11/29/20172:59 PM

## 2016-10-04 NOTE — Patient Instructions (Signed)
Steps to Quit Smoking Smoking tobacco can be bad for your health. It can also affect almost every organ in your body. Smoking puts you and people around you at risk for many serious long-lasting (chronic) diseases. Quitting smoking is hard, but it is one of the best things that you can do for your health. It is never too late to quit. What are the benefits of quitting smoking? When you quit smoking, you lower your risk for getting serious diseases and conditions. They can include:  Lung cancer or lung disease.  Heart disease.  Stroke.  Heart attack.  Not being able to have children (infertility).  Weak bones (osteoporosis) and broken bones (fractures). If you have coughing, wheezing, and shortness of breath, those symptoms may get better when you quit. You may also get sick less often. If you are pregnant, quitting smoking can help to lower your chances of having a baby of low birth weight. What can I do to help me quit smoking? Talk with your doctor about what can help you quit smoking. Some things you can do (strategies) include:  Quitting smoking totally, instead of slowly cutting back how much you smoke over a period of time.  Going to in-person counseling. You are more likely to quit if you go to many counseling sessions.  Using resources and support systems, such as:  Online chats with a counselor.  Phone quitlines.  Printed self-help materials.  Support groups or group counseling.  Text messaging programs.  Mobile phone apps or applications.  Taking medicines. Some of these medicines may have nicotine in them. If you are pregnant or breastfeeding, do not take any medicines to quit smoking unless your doctor says it is okay. Talk with your doctor about counseling or other things that can help you. Talk with your doctor about using more than one strategy at the same time, such as taking medicines while you are also going to in-person counseling. This can help make quitting  easier. What things can I do to make it easier to quit? Quitting smoking might feel very hard at first, but there is a lot that you can do to make it easier. Take these steps:  Talk to your family and friends. Ask them to support and encourage you.  Call phone quitlines, reach out to support groups, or work with a counselor.  Ask people who smoke to not smoke around you.  Avoid places that make you want (trigger) to smoke, such as:  Bars.  Parties.  Smoke-break areas at work.  Spend time with people who do not smoke.  Lower the stress in your life. Stress can make you want to smoke. Try these things to help your stress:  Getting regular exercise.  Deep-breathing exercises.  Yoga.  Meditating.  Doing a body scan. To do this, close your eyes, focus on one area of your body at a time from head to toe, and notice which parts of your body are tense. Try to relax the muscles in those areas.  Download or buy apps on your mobile phone or tablet that can help you stick to your quit plan. There are many free apps, such as QuitGuide from the CDC (Centers for Disease Control and Prevention). You can find more support from smokefree.gov and other websites. This information is not intended to replace advice given to you by your health care provider. Make sure you discuss any questions you have with your health care provider. Document Released: 08/19/2009 Document Revised: 06/20/2016 Document   Reviewed: 03/09/2015 Elsevier Interactive Patient Education  2017 Elsevier Inc.  

## 2016-10-25 ENCOUNTER — Telehealth: Payer: Self-pay | Admitting: Orthopaedic Surgery

## 2016-10-25 MED ORDER — OXYCODONE-ACETAMINOPHEN 7.5-325 MG PO TABS: 1.0000 | ORAL_TABLET | Freq: Four times a day (QID) | ORAL | 0 refills | Status: DC | PRN

## 2016-10-25 NOTE — Telephone Encounter (Signed)
Oxycodone-Acetaminophen  7.5/325 mg  Qty 110 Tablets °

## 2016-11-22 DIAGNOSIS — K029 Dental caries, unspecified: Secondary | ICD-10-CM | POA: Diagnosis not present

## 2016-11-22 DIAGNOSIS — R079 Chest pain, unspecified: Secondary | ICD-10-CM | POA: Diagnosis not present

## 2016-11-22 DIAGNOSIS — Z79899 Other long term (current) drug therapy: Secondary | ICD-10-CM | POA: Diagnosis not present

## 2016-11-22 DIAGNOSIS — E669 Obesity, unspecified: Secondary | ICD-10-CM | POA: Diagnosis not present

## 2016-11-22 DIAGNOSIS — R061 Stridor: Secondary | ICD-10-CM | POA: Diagnosis not present

## 2016-11-22 DIAGNOSIS — F17200 Nicotine dependence, unspecified, uncomplicated: Secondary | ICD-10-CM | POA: Diagnosis not present

## 2016-11-22 DIAGNOSIS — I10 Essential (primary) hypertension: Secondary | ICD-10-CM | POA: Diagnosis not present

## 2016-11-22 DIAGNOSIS — R0789 Other chest pain: Secondary | ICD-10-CM | POA: Diagnosis not present

## 2016-11-22 DIAGNOSIS — M791 Myalgia: Secondary | ICD-10-CM | POA: Diagnosis not present

## 2016-11-22 DIAGNOSIS — G44209 Tension-type headache, unspecified, not intractable: Secondary | ICD-10-CM | POA: Diagnosis not present

## 2016-11-23 DIAGNOSIS — F17200 Nicotine dependence, unspecified, uncomplicated: Secondary | ICD-10-CM | POA: Diagnosis not present

## 2016-11-23 DIAGNOSIS — Z79899 Other long term (current) drug therapy: Secondary | ICD-10-CM | POA: Diagnosis not present

## 2016-11-23 DIAGNOSIS — I10 Essential (primary) hypertension: Secondary | ICD-10-CM | POA: Diagnosis not present

## 2016-11-23 DIAGNOSIS — K0889 Other specified disorders of teeth and supporting structures: Secondary | ICD-10-CM | POA: Diagnosis not present

## 2016-11-23 DIAGNOSIS — K029 Dental caries, unspecified: Secondary | ICD-10-CM | POA: Diagnosis not present

## 2016-11-23 DIAGNOSIS — K047 Periapical abscess without sinus: Secondary | ICD-10-CM | POA: Diagnosis not present

## 2016-12-06 ENCOUNTER — Telehealth: Payer: Self-pay | Admitting: Orthopaedic Surgery

## 2016-12-06 MED ORDER — OXYCODONE-ACETAMINOPHEN 7.5-325 MG PO TABS: 1.0000 | ORAL_TABLET | Freq: Four times a day (QID) | ORAL | 0 refills | Status: DC | PRN

## 2016-12-06 NOTE — Telephone Encounter (Signed)
Oxycodone-Acetaminophen  7.5/325 mg  Qty 110 Tablets °

## 2016-12-29 DIAGNOSIS — J45901 Unspecified asthma with (acute) exacerbation: Secondary | ICD-10-CM | POA: Diagnosis not present

## 2016-12-29 DIAGNOSIS — J029 Acute pharyngitis, unspecified: Secondary | ICD-10-CM | POA: Diagnosis not present

## 2016-12-29 DIAGNOSIS — F17209 Nicotine dependence, unspecified, with unspecified nicotine-induced disorders: Secondary | ICD-10-CM | POA: Diagnosis not present

## 2016-12-29 DIAGNOSIS — F17200 Nicotine dependence, unspecified, uncomplicated: Secondary | ICD-10-CM | POA: Diagnosis not present

## 2016-12-29 DIAGNOSIS — J45909 Unspecified asthma, uncomplicated: Secondary | ICD-10-CM | POA: Diagnosis not present

## 2016-12-29 DIAGNOSIS — Z79899 Other long term (current) drug therapy: Secondary | ICD-10-CM | POA: Diagnosis not present

## 2016-12-29 DIAGNOSIS — H6503 Acute serous otitis media, bilateral: Secondary | ICD-10-CM | POA: Diagnosis not present

## 2016-12-29 DIAGNOSIS — J4521 Mild intermittent asthma with (acute) exacerbation: Secondary | ICD-10-CM | POA: Diagnosis not present

## 2016-12-29 DIAGNOSIS — I10 Essential (primary) hypertension: Secondary | ICD-10-CM | POA: Diagnosis not present

## 2017-01-03 ENCOUNTER — Ambulatory Visit (INDEPENDENT_AMBULATORY_CARE_PROVIDER_SITE_OTHER): Payer: Medicare Other | Admitting: Orthopaedic Surgery

## 2017-01-03 VITALS — BP 151/88 | HR 102 | Temp 97.5°F | Ht 73.0 in | Wt 302.0 lb

## 2017-01-03 DIAGNOSIS — F17210 Nicotine dependence, cigarettes, uncomplicated: Secondary | ICD-10-CM | POA: Diagnosis not present

## 2017-01-03 DIAGNOSIS — G8929 Other chronic pain: Secondary | ICD-10-CM

## 2017-01-03 DIAGNOSIS — M545 Low back pain, unspecified: Secondary | ICD-10-CM

## 2017-01-03 MED ORDER — OXYCODONE-ACETAMINOPHEN 7.5-325 MG PO TABS: 1.0000 | ORAL_TABLET | Freq: Four times a day (QID) | ORAL | 0 refills | Status: DC | PRN

## 2017-01-03 NOTE — Patient Instructions (Signed)
Steps to Quit Smoking Smoking tobacco can be bad for your health. It can also affect almost every organ in your body. Smoking puts you and people around you at risk for many serious Tarin Johndrow-lasting (chronic) diseases. Quitting smoking is hard, but it is one of the best things that you can do for your health. It is never too late to quit. What are the benefits of quitting smoking? When you quit smoking, you lower your risk for getting serious diseases and conditions. They can include:  Lung cancer or lung disease.  Heart disease.  Stroke.  Heart attack.  Not being able to have children (infertility).  Weak bones (osteoporosis) and broken bones (fractures). If you have coughing, wheezing, and shortness of breath, those symptoms may get better when you quit. You may also get sick less often. If you are pregnant, quitting smoking can help to lower your chances of having a baby of low birth weight. What can I do to help me quit smoking? Talk with your doctor about what can help you quit smoking. Some things you can do (strategies) include:  Quitting smoking totally, instead of slowly cutting back how much you smoke over a period of time.  Going to in-person counseling. You are more likely to quit if you go to many counseling sessions.  Using resources and support systems, such as:  Online chats with a counselor.  Phone quitlines.  Printed self-help materials.  Support groups or group counseling.  Text messaging programs.  Mobile phone apps or applications.  Taking medicines. Some of these medicines may have nicotine in them. If you are pregnant or breastfeeding, do not take any medicines to quit smoking unless your doctor says it is okay. Talk with your doctor about counseling or other things that can help you. Talk with your doctor about using more than one strategy at the same time, such as taking medicines while you are also going to in-person counseling. This can help make quitting  easier. What things can I do to make it easier to quit? Quitting smoking might feel very hard at first, but there is a lot that you can do to make it easier. Take these steps:  Talk to your family and friends. Ask them to support and encourage you.  Call phone quitlines, reach out to support groups, or work with a counselor.  Ask people who smoke to not smoke around you.  Avoid places that make you want (trigger) to smoke, such as:  Bars.  Parties.  Smoke-break areas at work.  Spend time with people who do not smoke.  Lower the stress in your life. Stress can make you want to smoke. Try these things to help your stress:  Getting regular exercise.  Deep-breathing exercises.  Yoga.  Meditating.  Doing a body scan. To do this, close your eyes, focus on one area of your body at a time from head to toe, and notice which parts of your body are tense. Try to relax the muscles in those areas.  Download or buy apps on your mobile phone or tablet that can help you stick to your quit plan. There are many free apps, such as QuitGuide from the CDC (Centers for Disease Control and Prevention). You can find more support from smokefree.gov and other websites. This information is not intended to replace advice given to you by your health care provider. Make sure you discuss any questions you have with your health care provider. Document Released: 08/19/2009 Document Revised: 06/20/2016 Document   Reviewed: 03/09/2015 Elsevier Interactive Patient Education  2017 Elsevier Inc.  

## 2017-01-03 NOTE — Progress Notes (Signed)
Patient EA:VWUJW:Hayden Villa, male DOB:1976-02-04, 41 y.o. [id number]  Chief Complaint  Patient presents with  . Follow-up    Chronic midline low back pain    HPI  Tobey Bridedwin T Smeltzer is a 41 y.o. male who has chronic lower back pain.  He has no new trauma, no paresthesia.  He is doing his exercises and taking his medicine.  He continues to smoke and is not quitting. HPI  Body mass index is 39.84 kg/m.  ROS  Review of Systems  HENT: Negative for congestion.   Respiratory: Negative for cough and shortness of breath.   Cardiovascular: Negative for chest pain.  Endocrine: Positive for cold intolerance.  Musculoskeletal: Positive for arthralgias and back pain.  Allergic/Immunologic: Positive for environmental allergies.    Past Medical History:  Diagnosis Date  . Asthma   . Hypertension     Past Surgical History:  Procedure Laterality Date  . NO PAST SURGERIES      Family History  Problem Relation Age of Onset  . Hypertension Other     Social History Social History  Substance Use Topics  . Smoking status: Current Every Day Smoker    Packs/day: 1.50    Years: 25.00    Types: Cigarettes  . Smokeless tobacco: Never Used  . Alcohol use Yes     Comment: rarely    No Known Allergies  Current Outpatient Prescriptions  Medication Sig Dispense Refill  . albuterol (VENTOLIN HFA) 108 (90 BASE) MCG/ACT inhaler Inhale 2 puffs into the lungs every 6 (six) hours as needed for wheezing or shortness of breath.     . clindamycin (CLEOCIN) 300 MG capsule Take 1 capsule (300 mg total) by mouth 4 (four) times daily. 28 capsule 0  . labetalol (NORMODYNE) 100 MG tablet Take 100 mg by mouth 2 (two) times daily.    Marland Kitchen. oxyCODONE-acetaminophen (PERCOCET) 7.5-325 MG tablet Take 1 tablet by mouth every 6 (six) hours as needed for moderate pain or severe pain (Must last 30 days.Do not take and drive a car or operate machinery). 100 tablet 0   No current facility-administered medications  for this visit.      Physical Exam  Blood pressure (!) 151/88, pulse (!) 102, temperature 97.5 F (36.4 C), height 6\' 1"  (1.854 m), weight (!) 302 lb (137 kg).  Constitutional: overall normal hygiene, normal nutrition, well developed, normal grooming, normal body habitus. Assistive device:none  Musculoskeletal: gait and station Limp none, muscle tone and strength are normal, no tremors or atrophy is present.  .  Neurological: coordination overall normal.  Deep tendon reflex/nerve stretch intact.  Sensation normal.  Cranial nerves II-XII intact.   Skin:   Normal overall no scars, lesions, ulcers or rashes. No psoriasis.  Psychiatric: Alert and oriented x 3.  Recent memory intact, remote memory unclear.  Normal mood and affect. Well groomed.  Good eye contact.  Cardiovascular: overall no swelling, no varicosities, no edema bilaterally, normal temperatures of the legs and arms, no clubbing, cyanosis and good capillary refill.  Lymphatic: palpation is normal.  Spine/Pelvis examination:  Inspection:  Overall, sacoiliac joint benign and hips nontender; without crepitus or defects.   Thoracic spine inspection: Alignment normal without kyphosis present   Lumbar spine inspection:  Alignment  with normal lumbar lordosis, without scoliosis apparent.   Thoracic spine palpation:  without tenderness of spinal processes   Lumbar spine palpation: with tenderness of lumbar area; without tightness of lumbar muscles    Range of Motion:   Lumbar flexion,  forward flexion is 45 without pain or tenderness    Lumbar extension is 10 without pain or tenderness   Left lateral bend is Normal  without pain or tenderness   Right lateral bend is Normal without pain or tenderness   Straight leg raising is Normal   Strength & tone: Normal   Stability overall normal stability     The patient has been educated about the nature of the problem(s) and counseled on treatment options.  The patient appeared  to understand what I have discussed and is in agreement with it.  Encounter Diagnoses  Name Primary?  . Chronic midline low back pain without sciatica Yes  . Cigarette nicotine dependence without complication     PLAN Call if any problems.  Precautions discussed.  Continue current medications.   Return to clinic 3 months   I have reviewed the [facility] Controlled Substance Reporting System web site prior to prescribing narcotic medicine for this patient.  Electronically Signed Darreld Mclean, MD 2/28/20183:10 PM

## 2017-01-08 DIAGNOSIS — F17209 Nicotine dependence, unspecified, with unspecified nicotine-induced disorders: Secondary | ICD-10-CM | POA: Diagnosis not present

## 2017-01-08 DIAGNOSIS — I10 Essential (primary) hypertension: Secondary | ICD-10-CM | POA: Diagnosis not present

## 2017-01-08 DIAGNOSIS — K0889 Other specified disorders of teeth and supporting structures: Secondary | ICD-10-CM | POA: Diagnosis not present

## 2017-01-08 DIAGNOSIS — Z79899 Other long term (current) drug therapy: Secondary | ICD-10-CM | POA: Diagnosis not present

## 2017-01-08 DIAGNOSIS — F17200 Nicotine dependence, unspecified, uncomplicated: Secondary | ICD-10-CM | POA: Diagnosis not present

## 2017-02-06 ENCOUNTER — Telehealth: Payer: Self-pay | Admitting: Orthopaedic Surgery

## 2017-02-06 MED ORDER — OXYCODONE-ACETAMINOPHEN 7.5-325 MG PO TABS: 1.0000 | ORAL_TABLET | Freq: Four times a day (QID) | ORAL | 0 refills | Status: DC | PRN

## 2017-02-06 NOTE — Telephone Encounter (Signed)
Oxycodone-Acetaminophen 7.5/325mg  Qty 100 Tablets °

## 2017-02-12 DIAGNOSIS — J45909 Unspecified asthma, uncomplicated: Secondary | ICD-10-CM | POA: Diagnosis not present

## 2017-02-12 DIAGNOSIS — Z87891 Personal history of nicotine dependence: Secondary | ICD-10-CM | POA: Diagnosis not present

## 2017-02-12 DIAGNOSIS — R05 Cough: Secondary | ICD-10-CM | POA: Diagnosis not present

## 2017-02-12 DIAGNOSIS — J1089 Influenza due to other identified influenza virus with other manifestations: Secondary | ICD-10-CM | POA: Diagnosis not present

## 2017-02-12 DIAGNOSIS — I10 Essential (primary) hypertension: Secondary | ICD-10-CM | POA: Diagnosis not present

## 2017-02-12 DIAGNOSIS — J439 Emphysema, unspecified: Secondary | ICD-10-CM | POA: Diagnosis not present

## 2017-02-12 DIAGNOSIS — J101 Influenza due to other identified influenza virus with other respiratory manifestations: Secondary | ICD-10-CM | POA: Diagnosis not present

## 2017-02-12 DIAGNOSIS — Z79899 Other long term (current) drug therapy: Secondary | ICD-10-CM | POA: Diagnosis not present

## 2017-02-12 DIAGNOSIS — R0602 Shortness of breath: Secondary | ICD-10-CM | POA: Diagnosis not present

## 2017-02-12 DIAGNOSIS — J45901 Unspecified asthma with (acute) exacerbation: Secondary | ICD-10-CM | POA: Diagnosis not present

## 2017-03-01 ENCOUNTER — Telehealth: Payer: Self-pay | Admitting: Orthopaedic Surgery

## 2017-03-01 MED ORDER — OXYCODONE-ACETAMINOPHEN 7.5-325 MG PO TABS: 1.0000 | ORAL_TABLET | Freq: Four times a day (QID) | ORAL | 0 refills | Status: DC | PRN

## 2017-03-01 NOTE — Telephone Encounter (Signed)
Patient requests refill on Oxycodone/Acetaminophen (Percocet)  7.5-325  mgs.   Qty  90       Sig: Take 1 tablet by mouth every 6 (six) hours as needed for moderate pain or severe pain (Must last 30 days.Do not take and drive a car or operate machinery).

## 2017-04-04 ENCOUNTER — Ambulatory Visit (INDEPENDENT_AMBULATORY_CARE_PROVIDER_SITE_OTHER): Payer: Medicare Other

## 2017-04-04 ENCOUNTER — Ambulatory Visit (INDEPENDENT_AMBULATORY_CARE_PROVIDER_SITE_OTHER): Payer: Medicare Other | Admitting: Orthopaedic Surgery

## 2017-04-04 VITALS — BP 152/93 | HR 98 | Temp 97.0°F | Ht 73.0 in | Wt 308.0 lb

## 2017-04-04 DIAGNOSIS — G8929 Other chronic pain: Secondary | ICD-10-CM

## 2017-04-04 DIAGNOSIS — M545 Low back pain, unspecified: Secondary | ICD-10-CM

## 2017-04-04 DIAGNOSIS — F17210 Nicotine dependence, cigarettes, uncomplicated: Secondary | ICD-10-CM | POA: Diagnosis not present

## 2017-04-04 MED ORDER — OXYCODONE-ACETAMINOPHEN 7.5-325 MG PO TABS: 1.0000 | ORAL_TABLET | Freq: Four times a day (QID) | ORAL | 0 refills | Status: DC | PRN

## 2017-04-04 NOTE — Patient Instructions (Signed)
Coping with Quitting Smoking Quitting smoking is a physical and mental challenge. You will face cravings, withdrawal symptoms, and temptation. Before quitting, work with your health care provider to make a plan that can help you cope. Preparation can help you quit and keep you from giving in. How can I cope with cravings? Cravings usually last for 5-10 minutes. If you get through it, the craving will pass. Consider taking the following actions to help you cope with cravings:  Keep your mouth busy:  Chew sugar-free gum.  Suck on hard candies or a straw.  Brush your teeth.  Keep your hands and body busy:  Immediately change to a different activity when you feel a craving.  Squeeze or play with a ball.  Do an activity or a hobby, like making bead jewelry, practicing needlepoint, or working with wood.  Mix up your normal routine.  Take a short exercise break. Go for a quick walk or run up and down stairs.  Spend time in public places where smoking is not allowed.  Focus on doing something kind or helpful for someone else.  Call a friend or family member to talk during a craving.  Join a support group.  Call a quit line, such as 1-800-QUIT-NOW.  Talk with your health care provider about medicines that might help you cope with cravings and make quitting easier for you. How can I deal with withdrawal symptoms? Your body may experience negative effects as it tries to get used to not having nicotine in the system. These effects are called withdrawal symptoms. They may include:  Feeling hungrier than normal.  Trouble concentrating.  Irritability.  Trouble sleeping.  Feeling depressed.  Restlessness and agitation.  Craving a cigarette. 1.  To manage withdrawal symptoms:  Avoid places, people, and activities that trigger your cravings.  Remember why you want to quit.  Get plenty of sleep.  Avoid coffee and other caffeinated drinks. These may worsen some of your  symptoms. How can I handle social situations? Social situations can be difficult when you are quitting smoking, especially in the first few weeks. To manage this, you can:  Avoid parties, bars, and other social situations where people might be smoking.  Avoid alcohol.  Leave right away if you have the urge to smoke.  Explain to your family and friends that you are quitting smoking. Ask for understanding and support.  Plan activities with friends or family where smoking is not an option. What are some ways I can cope with stress? Wanting to smoke may cause stress, and stress can make you want to smoke. Find ways to manage your stress. Relaxation techniques can help. For example:  Breathe slowly and deeply, in through your nose and out through your mouth.  Listen to soothing, relaxing music.  Talk with a family member or friend about your stress.  Light a candle.  Soak in a bath or take a shower.  Think about a peaceful place. What are some ways I can prevent weight gain? Be aware that many people gain weight after they quit smoking. However, not everyone does. To keep from gaining weight, have a plan in place before you quit and stick to the plan after you quit. Your plan should include:  Having healthy snacks. When you have a craving, it may help to:  Eat plain popcorn, crunchy carrots, celery, or other cut vegetables.  Chew sugar-free gum.  Changing how you eat:  Eat small portion sizes at meals.  Eat 4-6 small   meals throughout the day instead of 1-2 large meals a day.  Be mindful when you eat. Do not watch television or do other things that might distract you as you eat.  Exercising regularly:  Make time to exercise each day. If you do not have time for a long workout, do short bouts of exercise for 5-10 minutes several times a day.  Do some form of strengthening exercise, like weight lifting, and some form of aerobic exercise, like running or swimming.  Drinking  plenty of water or other low-calorie or no-calorie drinks. Drink 6-8 glasses of water daily, or as much as instructed by your health care provider. Summary  Quitting smoking is a physical and mental challenge. You will face cravings, withdrawal symptoms, and temptation to smoke again. Preparation can help you as you go through these challenges.  You can cope with cravings by keeping your mouth busy (such as by chewing gum), keeping your body and hands busy, and making calls to family, friends, or a helpline for people who want to quit smoking.  You can cope with withdrawal symptoms by avoiding places where people smoke, avoiding drinks with caffeine, and getting plenty of rest.  Ask your health care provider about the different ways to prevent weight gain, avoid stress, and handle social situations. This information is not intended to replace advice given to you by your health care provider. Make sure you discuss any questions you have with your health care provider. Document Released: 10/20/2016 Document Revised: 10/20/2016 Document Reviewed: 10/20/2016 Elsevier Interactive Patient Education  2017 Elsevier Inc.  

## 2017-04-04 NOTE — Progress Notes (Signed)
Patient ZO:XWRUE NEPHTALI DOCKEN, Hayden Villa:12/05/1975, 41 y.o. AVW:[id number]  Chief Complaint  Patient presents with  . Follow-up    Chronic low back pian    HPI  Hayden Villa is a 41 y.o. Hayden who has increasing lower back pain with no sciatica.  He has no new trauma.  He is working full time.  He has more pain after work.  He is not sleeping well.  He has no weakness.  I will try to get a MRI of his lower back.  He continues to smoke. He has tried to cut back. HPI  Body mass index is 40.64 kg/m.  ROS  Review of Systems  HENT: Negative for congestion.   Respiratory: Negative for cough and shortness of breath.   Cardiovascular: Negative for chest pain.  Endocrine: Positive for cold intolerance.  Musculoskeletal: Positive for arthralgias and back pain.  Allergic/Immunologic: Positive for environmental allergies.    Past Medical History:  Diagnosis Date  . Asthma   . Hypertension     Past Surgical History:  Procedure Laterality Date  . NO PAST SURGERIES      Family History  Problem Relation Age of Onset  . Hypertension Other     Social History Social History  Substance Use Topics  . Smoking status: Current Every Day Smoker    Packs/day: 1.50    Years: 25.00    Types: Cigarettes  . Smokeless tobacco: Never Used  . Alcohol use Yes     Comment: rarely    No Known Allergies  Current Outpatient Prescriptions  Medication Sig Dispense Refill  . albuterol (VENTOLIN HFA) 108 (90 BASE) MCG/ACT inhaler Inhale 2 puffs into the lungs every 6 (six) hours as needed for wheezing or shortness of breath.     . clindamycin (CLEOCIN) 300 MG capsule Take 1 capsule (300 mg total) by mouth 4 (four) times daily. 28 capsule 0  . labetalol (NORMODYNE) 100 MG tablet Take 100 mg by mouth 2 (two) times daily.    Marland Kitchen oxyCODONE-acetaminophen (PERCOCET) 7.5-325 MG tablet Take 1 tablet by mouth every 6 (six) hours as needed for moderate pain or severe pain (Must last 30 days.Do not take and  drive a car or operate machinery). 90 tablet 0   No current facility-administered medications for this visit.      Physical Exam  Blood pressure (!) 152/93, pulse 98, temperature 97 F (36.1 C), height 6\' 1"  (1.854 m), weight (!) 308 lb (139.7 kg).  Constitutional: overall normal hygiene, normal nutrition, well developed, normal grooming, normal body habitus. Assistive device:none  Musculoskeletal: gait and station Limp none, muscle tone and strength are normal, no tremors or atrophy is present.  .  Neurological: coordination overall normal.  Deep tendon reflex/nerve stretch intact.  Sensation normal.  Cranial nerves II-XII intact.   Skin:   Normal overall no scars, lesions, ulcers or rashes. No psoriasis.  Psychiatric: Alert and oriented x 3.  Recent memory intact, remote memory unclear.  Normal mood and affect. Well groomed.  Good eye contact.  Cardiovascular: overall no swelling, no varicosities, no edema bilaterally, normal temperatures of the legs and arms, no clubbing, cyanosis and good capillary refill.  Lymphatic: palpation is normal. Spine/Pelvis examination:  Inspection:  Overall, sacoiliac joint benign and hips nontender; without crepitus or defects.   Thoracic spine inspection: Alignment normal without kyphosis present   Lumbar spine inspection:  Alignment  with normal lumbar lordosis, without scoliosis apparent.   Thoracic spine palpation:  without tenderness of spinal  processes   Lumbar spine palpation: with tenderness of lumbar area; without tightness of lumbar muscles    Range of Motion:   Lumbar flexion, forward flexion is 35 with pain or tenderness    Lumbar extension is 5 with pain or tenderness   Left lateral bend is Normal  without pain or tenderness   Right lateral bend is Normal without pain or tenderness   Straight leg raising is Normal   Strength & tone: Normal   Stability overall normal stability      The patient has been educated about the  nature of the problem(s) and counseled on treatment options.  The patient appeared to understand what I have discussed and is in agreement with it.  Encounter Diagnoses  Name Primary?  . Chronic midline low back pain without sciatica Yes  . Cigarette nicotine dependence without complication     PLAN Call if any problems.  Precautions discussed.  Continue current medications.   Return to clinic after MRI of the lumbar spine   I have reviewed the [facility] web site prior to prescribing narcotic medicine for this patient.  Electronically Signed Darreld McleanWayne Mel Tadros, MD 5/30/20182:34 PM

## 2017-04-24 ENCOUNTER — Telehealth: Payer: Self-pay | Admitting: Orthopaedic Surgery

## 2017-04-24 NOTE — Telephone Encounter (Signed)
I spoke with the patient and gave him the phone number for [facility].

## 2017-04-24 NOTE — Telephone Encounter (Signed)
Patient stopped by office today, 04/24/17; states he was contacted about MRI but was unable to set up at the time that it was ordered / approved. (Referral notes indicate: "left 3 messages for him to call them.  On 04/10/17 they sent a letter.  I called him on 04/18/17 his [phone] number is out of order and I left a message on the [phone] number to call me"   Please advise patient - I re-verified ph# [phone] - states this is the only working phone# at this time.

## 2017-05-07 ENCOUNTER — Telehealth: Payer: Self-pay | Admitting: Orthopaedic Surgery

## 2017-05-07 ENCOUNTER — Other Ambulatory Visit: Payer: Self-pay | Admitting: *Deleted

## 2017-05-07 MED ORDER — OXYCODONE-ACETAMINOPHEN 7.5-325 MG PO TABS: 1.0000 | ORAL_TABLET | Freq: Four times a day (QID) | ORAL | 0 refills | Status: DC | PRN

## 2017-05-07 NOTE — Telephone Encounter (Signed)
Oxycodone-Acetaminophen  7.5/325 mg Qty 130 Tablets  Take 1 tablet by mouth every 6(six) hours as needed for moderate pain or severe pain (Must last 30 days. Do not take and drive a car or operate machinery).

## 2017-06-04 ENCOUNTER — Telehealth: Payer: Self-pay | Admitting: Orthopaedic Surgery

## 2017-06-04 NOTE — Telephone Encounter (Signed)
Oxycodone-Acetaminophen  7.5/325 mg    Qty 130 Tablets °

## 2017-06-05 MED ORDER — OXYCODONE-ACETAMINOPHEN 7.5-325 MG PO TABS: 1.0000 | ORAL_TABLET | Freq: Four times a day (QID) | ORAL | 0 refills | Status: DC | PRN

## 2017-07-05 ENCOUNTER — Ambulatory Visit (INDEPENDENT_AMBULATORY_CARE_PROVIDER_SITE_OTHER): Payer: Medicare Other | Admitting: Orthopaedic Surgery

## 2017-07-05 VITALS — BP 140/91 | HR 79 | Temp 97.2°F | Ht 73.0 in | Wt 305.0 lb

## 2017-07-05 DIAGNOSIS — M545 Low back pain, unspecified: Secondary | ICD-10-CM

## 2017-07-05 DIAGNOSIS — G8929 Other chronic pain: Secondary | ICD-10-CM

## 2017-07-05 DIAGNOSIS — F17210 Nicotine dependence, cigarettes, uncomplicated: Secondary | ICD-10-CM | POA: Diagnosis not present

## 2017-07-05 MED ORDER — OXYCODONE-ACETAMINOPHEN 7.5-325 MG PO TABS: 1.0000 | ORAL_TABLET | Freq: Four times a day (QID) | ORAL | 0 refills | Status: DC | PRN

## 2017-07-05 NOTE — Progress Notes (Signed)
[name], male DOB:01-04-1976, 41 y.o. AVW:[id number]  Chief Complaint  Patient presents with  . Follow-up    Back pain    HPI  Hayden Villa is a 41 y.o. male who continues to have lower back pain with no sciatica.  He is worse.  I had him scheduled for MRI of the lumbar spine but he did not go secondary to death in his family.  He did not call to let me know.  He would like to have the MRI now.  He has no weakness, no trauma.  He is taking his pain medicine and doing his exercises.  He still smokes and is not willing to quit. HPI  Body mass index is 40.24 kg/m.  ROS  Review of Systems  HENT: Negative for congestion.   Respiratory: Negative for cough and shortness of breath.   Cardiovascular: Negative for chest pain.  Endocrine: Positive for cold intolerance.  Musculoskeletal: Positive for arthralgias and back pain.  Allergic/Immunologic: Positive for environmental allergies.    Past Medical History:  Diagnosis Date  . Asthma   . Hypertension     Past Surgical History:  Procedure Laterality Date  . NO PAST SURGERIES      Family History  Problem Relation Age of Onset  . Hypertension Other     Social History Social History  Substance Use Topics  . Smoking status: Current Every Day Smoker    Packs/day: 1.50    Years: 25.00    Types: Cigarettes  . Smokeless tobacco: Never Used  . Alcohol use Yes     Comment: rarely    No Known Allergies  Current Outpatient Prescriptions  Medication Sig Dispense Refill  . albuterol (VENTOLIN HFA) 108 (90 BASE) MCG/ACT inhaler Inhale 2 puffs into the lungs every 6 (six) hours as needed for wheezing or shortness of breath.     . clindamycin (CLEOCIN) 300 MG capsule Take 1 capsule (300 mg total) by mouth 4 (four) times daily. 28 capsule 0  . labetalol (NORMODYNE) 100 MG tablet Take 100 mg by mouth 2 (two) times daily.    Marland Kitchen oxyCODONE-acetaminophen (PERCOCET) 7.5-325 MG tablet Take 1 tablet by mouth every 6 (six)  hours as needed for moderate pain or severe pain (Must last 30 days.Do not take and drive a car or operate machinery). 120 tablet 0   No current facility-administered medications for this visit.      Physical Exam  Blood pressure (!) 140/91, pulse 79, temperature (!) 97.2 F (36.2 C), height 6\' 1"  (1.854 m), weight (!) 305 lb (138.3 kg).  Constitutional: overall normal hygiene, normal nutrition, well developed, normal grooming, normal body habitus. Assistive device:none  Musculoskeletal: gait and station Limp none, muscle tone and strength are normal, no tremors or atrophy is present.  .  Neurological: coordination overall normal.  Deep tendon reflex/nerve stretch intact.  Sensation normal.  Cranial nerves II-XII intact.   Skin:   Normal overall no scars, lesions, ulcers or rashes. No psoriasis.  Psychiatric: Alert and oriented x 3.  Recent memory intact, remote memory unclear.  Normal mood and affect. Well groomed.  Good eye contact.  Cardiovascular: overall no swelling, no varicosities, no edema bilaterally, normal temperatures of the legs and arms, no clubbing, cyanosis and good capillary refill.  Lymphatic: palpation is normal.  Spine/Pelvis examination:  Inspection:  Overall, sacoiliac joint benign and hips nontender; without crepitus or defects.   Thoracic spine inspection: Alignment normal without kyphosis present   Lumbar spine inspection:  Alignment  with normal lumbar lordosis, without scoliosis apparent.   Thoracic spine palpation:  without tenderness of spinal processes   Lumbar spine palpation: with tenderness of lumbar area; without tightness of lumbar muscles    Range of Motion:   Lumbar flexion, forward flexion is 45 without pain or tenderness    Lumbar extension is 5 without pain or tenderness   Left lateral bend is Normal  without pain or tenderness   Right lateral bend is Normal without pain or tenderness   Straight leg raising is Normal   Strength &  tone: Normal   Stability overall normal stability     The patient has been educated about the nature of the problem(s) and counseled on treatment options.  The patient appeared to understand what I have discussed and is in agreement with it.  Encounter Diagnoses  Name Primary?  . Chronic midline low back pain without sciatica Yes  . Cigarette nicotine dependence without complication     PLAN Call if any problems.  Precautions discussed.  Continue current medications.   Return to clinic MRI lumbar spine   I have reviewed the [facility] web site prior to prescribing narcotic medicine for this patient. Electronically Signed Darreld McleanWayne Kamren Heskett, MD 8/30/20189:46 AM

## 2017-07-05 NOTE — Patient Instructions (Signed)
Steps to Quit Smoking Smoking tobacco can be bad for your health. It can also affect almost every organ in your body. Smoking puts you and people around you at risk for many serious [place]-lasting (chronic) diseases. Quitting smoking is hard, but it is one of the best things that you can do for your health. It is never too late to quit. What are the benefits of quitting smoking? When you quit smoking, you lower your risk for getting serious diseases and conditions. They can include:  Lung cancer or lung disease.  Heart disease.  Stroke.  Heart attack.  Not being able to have children (infertility).  Weak bones (osteoporosis) and broken bones (fractures).  If you have coughing, wheezing, and shortness of breath, those symptoms may get better when you quit. You may also get sick less often. If you are pregnant, quitting smoking can help to lower your chances of having a baby of low birth weight. What can I do to help me quit smoking? Talk with your doctor about what can help you quit smoking. Some things you can do (strategies) include:  Quitting smoking totally, instead of slowly cutting back how much you smoke over a period of time.  Going to in-person counseling. You are more likely to quit if you go to many counseling sessions.  Using resources and support systems, such as: ? Online chats with a counselor. ? Phone quitlines. ? Printed self-help materials. ? Support groups or group counseling. ? Text messaging programs. ? Mobile phone apps or applications.  Taking medicines. Some of these medicines may have nicotine in them. If you are pregnant or breastfeeding, do not take any medicines to quit smoking unless your doctor says it is okay. Talk with your doctor about counseling or other things that can help you.  Talk with your doctor about using more than one strategy at the same time, such as taking medicines while you are also going to in-person counseling. This can help make  quitting easier. What things can I do to make it easier to quit? Quitting smoking might feel very hard at first, but there is a lot that you can do to make it easier. Take these steps:  Talk to your family and friends. Ask them to support and encourage you.  Call phone quitlines, reach out to support groups, or work with a counselor.  Ask people who smoke to not smoke around you.  Avoid places that make you want (trigger) to smoke, such as: ? Bars. ? Parties. ? Smoke-break areas at work.  Spend time with people who do not smoke.  Lower the stress in your life. Stress can make you want to smoke. Try these things to help your stress: ? Getting regular exercise. ? Deep-breathing exercises. ? Yoga. ? Meditating. ? Doing a body scan. To do this, close your eyes, focus on one area of your body at a time from head to toe, and notice which parts of your body are tense. Try to relax the muscles in those areas.  Download or buy apps on your mobile phone or tablet that can help you stick to your quit plan. There are many free apps, such as QuitGuide from the CDC (Centers for Disease Control and Prevention). You can find more support from smokefree.gov and other websites.  This information is not intended to replace advice given to you by your health care provider. Make sure you discuss any questions you have with your health care provider. Document Released: 08/19/2009 Document   Revised: 06/20/2016 Document Reviewed: 03/09/2015 Elsevier Interactive Patient Education  2018 Elsevier Inc.  

## 2017-07-20 ENCOUNTER — Other Ambulatory Visit: Payer: Medicare Other

## 2017-07-30 ENCOUNTER — Telehealth: Payer: Self-pay | Admitting: Orthopedic Surgery

## 2017-07-30 ENCOUNTER — Telehealth: Payer: Self-pay | Admitting: Orthopaedic Surgery

## 2017-07-30 NOTE — Telephone Encounter (Signed)
Patient requests refill Oxycodone/Acetaminophen 7.5-325 mgs.   Qty  120  Sig: Take 1 tablet by mouth every 6 (six) hours as needed for moderate pain or severe pain (Must last 30 days.Do not take and drive a car or operate machinery).

## 2017-07-31 MED ORDER — OXYCODONE-ACETAMINOPHEN 7.5-325 MG PO TABS: 1.0000 | ORAL_TABLET | Freq: Four times a day (QID) | ORAL | 0 refills | Status: DC | PRN

## 2017-08-08 ENCOUNTER — Other Ambulatory Visit: Payer: Medicare Other

## 2017-08-29 ENCOUNTER — Telehealth: Payer: Self-pay | Admitting: Orthopaedic Surgery

## 2017-08-29 MED ORDER — OXYCODONE-ACETAMINOPHEN 7.5-325 MG PO TABS: 1.0000 | ORAL_TABLET | Freq: Four times a day (QID) | ORAL | 0 refills | Status: DC | PRN

## 2017-08-29 NOTE — Telephone Encounter (Signed)
Oxycodone-Acetaminophen 7.5/325mg  Qty 120 Tablets °

## 2017-09-26 ENCOUNTER — Telehealth: Payer: Self-pay | Admitting: Orthopaedic Surgery

## 2017-09-26 MED ORDER — OXYCODONE-ACETAMINOPHEN 7.5-325 MG PO TABS
1.0000 | ORAL_TABLET | Freq: Four times a day (QID) | ORAL | 0 refills | Status: DC | PRN
Start: 2017-09-26 — End: 2017-10-23

## 2017-09-26 NOTE — Telephone Encounter (Signed)
Refill request on Oxycodone/Acetgaminophen 7.5-325  Mgs.   Qty  120   Sig: Take 1 tablet by mouth every 6 (six) hours as needed for moderate pain or severe pain (Must last 30 days.Do not take and drive a car or operate machinery).

## 2017-10-23 ENCOUNTER — Telehealth: Payer: Self-pay | Admitting: Orthopaedic Surgery

## 2017-10-23 MED ORDER — OXYCODONE-ACETAMINOPHEN 7.5-325 MG PO TABS: 1.0000 | ORAL_TABLET | Freq: Four times a day (QID) | ORAL | 0 refills | Status: DC | PRN

## 2017-10-23 NOTE — Telephone Encounter (Signed)
Patient requests refill on Oxycodone/Acetaminohen 7.5-325  Mgs.   Qty 120   Sig: Take 1 tablet by mouth every 6 (six) hours as needed for moderate pain or severe pain (Must last 30 days.Do not take and drive a car or operate machinery).  Patient uses [facility] in [place].

## 2017-11-22 ENCOUNTER — Ambulatory Visit (INDEPENDENT_AMBULATORY_CARE_PROVIDER_SITE_OTHER): Payer: Medicare Other | Admitting: Orthopaedic Surgery

## 2017-11-22 ENCOUNTER — Encounter: Payer: Self-pay | Admitting: Orthopaedic Surgery

## 2017-11-22 VITALS — BP 172/102 | HR 88 | Temp 98.1°F | Ht 73.0 in | Wt 315.0 lb

## 2017-11-22 DIAGNOSIS — G8929 Other chronic pain: Secondary | ICD-10-CM

## 2017-11-22 DIAGNOSIS — F17210 Nicotine dependence, cigarettes, uncomplicated: Secondary | ICD-10-CM | POA: Diagnosis not present

## 2017-11-22 DIAGNOSIS — M545 Low back pain: Secondary | ICD-10-CM | POA: Diagnosis not present

## 2017-11-22 MED ORDER — OXYCODONE-ACETAMINOPHEN 7.5-325 MG PO TABS: 1.0000 | ORAL_TABLET | Freq: Four times a day (QID) | ORAL | 0 refills | Status: DC | PRN

## 2017-11-22 NOTE — Progress Notes (Signed)
Patient ZO:XWRUE JAYVEON Villa, male DOB:Nov 24, 1975, 42 y.o. AVW:[id number]  Chief Complaint  Patient presents with  . Back Pain    lumbar    HPI  Hayden Villa is a 42 y.o. male who has chronic lower back pain.  He has more pain in the cold weather.  He has no new trauma.  He takes his medicine and does his exercises.  He has good and bad days. HPI  Body mass index is 41.56 kg/m.  ROS  Review of Systems  HENT: Negative for congestion.   Respiratory: Negative for cough and shortness of breath.   Cardiovascular: Negative for chest pain.  Endocrine: Positive for cold intolerance.  Musculoskeletal: Positive for arthralgias and back pain.  Allergic/Immunologic: Positive for environmental allergies.  All other systems reviewed and are negative.   Past Medical History:  Diagnosis Date  . Asthma   . Hypertension     Past Surgical History:  Procedure Laterality Date  . NO PAST SURGERIES      Family History  Problem Relation Age of Onset  . Hypertension Other     Social History Social History   Tobacco Use  . Smoking status: Current Every Day Smoker    Packs/day: 1.50    Years: 25.00    Pack years: 37.50    Types: Cigarettes  . Smokeless tobacco: Never Used  Substance Use Topics  . Alcohol use: Yes    Comment: rarely  . Drug use: Yes    Types: Marijuana    Comment: Patient denies    No Known Allergies  Current Outpatient Medications  Medication Sig Dispense Refill  . albuterol (VENTOLIN HFA) 108 (90 BASE) MCG/ACT inhaler Inhale 2 puffs into the lungs every 6 (six) hours as needed for wheezing or shortness of breath.     . clindamycin (CLEOCIN) 300 MG capsule Take 1 capsule (300 mg total) by mouth 4 (four) times daily. 28 capsule 0  . labetalol (NORMODYNE) 100 MG tablet Take 100 mg by mouth 2 (two) times daily.    Marland Kitchen oxyCODONE-acetaminophen (PERCOCET) 7.5-325 MG tablet Take 1 tablet by mouth every 6 (six) hours as needed for moderate pain or severe pain (Must last  30 days.Do not take and drive a car or operate machinery). 120 tablet 0   No current facility-administered medications for this visit.      Physical Exam  Blood pressure (!) 172/102, pulse 88, temperature 98.1 F (36.7 C), height 6\' 1"  (1.854 m), weight (!) 315 lb (142.9 kg).  Constitutional: overall normal hygiene, normal nutrition, well developed, normal grooming, normal body habitus. Assistive device:none  Musculoskeletal: gait and station Limp none, muscle tone and strength are normal, no tremors or atrophy is present.  .  Neurological: coordination overall normal.  Deep tendon reflex/nerve stretch intact.  Sensation normal.  Cranial nerves II-XII intact.   Skin:   Normal overall no scars, lesions, ulcers or rashes. No psoriasis.  Psychiatric: Alert and oriented x 3.  Recent memory intact, remote memory unclear.  Normal mood and affect. Well groomed.  Good eye contact.  Cardiovascular: overall no swelling, no varicosities, no edema bilaterally, normal temperatures of the legs and arms, no clubbing, cyanosis and good capillary refill.  Lymphatic: palpation is normal.  Spine/Pelvis examination:  Inspection:  Overall, sacoiliac joint benign and hips nontender; without crepitus or defects.   Thoracic spine inspection: Alignment normal without kyphosis present   Lumbar spine inspection:  Alignment  with normal lumbar lordosis, without scoliosis apparent.   Thoracic spine  palpation:  without tenderness of spinal processes   Lumbar spine palpation: without tenderness of lumbar area; without tightness of lumbar muscles    Range of Motion:   Lumbar flexion, forward flexion is normal without pain or tenderness    Lumbar extension is full without pain or tenderness   Left lateral bend is normal without pain or tenderness   Right lateral bend is normal without pain or tenderness   Straight leg raising is normal  Strength & tone: normal   Stability overall normal stability All  other systems reviewed and are negative   The patient has been educated about the nature of the problem(s) and counseled on treatment options.  The patient appeared to understand what I have discussed and is in agreement with it.  Encounter Diagnoses  Name Primary?  . Chronic midline low back pain without sciatica Yes  . Cigarette nicotine dependence without complication     PLAN Call if any problems.  Precautions discussed.  Continue current medications.   Return to clinic 4 months   I have reviewed the [facility] Cont[place] Sub[facility] Reporting [place] web site prior to prescribing narcotic medicine for this patient.  Electronically Signed Darreld McleanWayne Sebastien Jackson, MD 1/17/201910:21 AM

## 2017-11-22 NOTE — Patient Instructions (Signed)

## 2017-12-25 ENCOUNTER — Telehealth: Payer: Self-pay | Admitting: Orthopaedic Surgery

## 2017-12-25 MED ORDER — OXYCODONE-ACETAMINOPHEN 7.5-325 MG PO TABS: 1.0000 | ORAL_TABLET | Freq: Four times a day (QID) | ORAL | 0 refills | Status: DC | PRN

## 2017-12-25 NOTE — Telephone Encounter (Signed)
Patient called for refill:  oxyCODONE-acetaminophen (PERCOCET) 7.5-325 MG tablet 120 tablet  - requests change of pharmacy to [facility] [place]

## 2018-01-22 ENCOUNTER — Other Ambulatory Visit: Payer: Self-pay | Admitting: Orthopaedic Surgery

## 2018-01-22 MED ORDER — OXYCODONE-ACETAMINOPHEN 7.5-325 MG PO TABS: 1.0000 | ORAL_TABLET | Freq: Four times a day (QID) | ORAL | 0 refills | Status: DC | PRN

## 2018-01-22 NOTE — Telephone Encounter (Signed)
Patient called for refill - aware that while Dr Hilda LiasKeeling is out of clinic for extended period of time, that Dr Romeo AppleHarrison is reviewing requests.  oxyCODONE-acetaminophen (PERCOCET) 7.5-325 MG tablet, quantity 120 -[facility], [place]

## 2018-02-19 ENCOUNTER — Other Ambulatory Visit: Payer: Self-pay | Admitting: Orthopedic Surgery

## 2018-02-19 MED ORDER — OXYCODONE-ACETAMINOPHEN 7.5-325 MG PO TABS: 1.0000 | ORAL_TABLET | Freq: Four times a day (QID) | ORAL | 0 refills | Status: DC | PRN

## 2018-02-19 NOTE — Telephone Encounter (Signed)
Patient of Dr Sanjuan DameKeeling's - aware while Dr Hilda LiasKeeling out of office, Dr Romeo AppleHarrison is reviewing requests for refills:  oxyCODONE-acetaminophen (PERCOCET) 7.5-325 MG tablet 120 tablet   [facility]

## 2018-03-21 ENCOUNTER — Ambulatory Visit (INDEPENDENT_AMBULATORY_CARE_PROVIDER_SITE_OTHER): Payer: Medicare Other | Admitting: Orthopaedic Surgery

## 2018-03-21 ENCOUNTER — Encounter: Payer: Self-pay | Admitting: Orthopaedic Surgery

## 2018-03-21 VITALS — BP 131/75 | HR 97 | Ht 72.0 in | Wt 306.0 lb

## 2018-03-21 DIAGNOSIS — G8929 Other chronic pain: Secondary | ICD-10-CM | POA: Diagnosis not present

## 2018-03-21 DIAGNOSIS — M545 Low back pain: Secondary | ICD-10-CM

## 2018-03-21 DIAGNOSIS — F17210 Nicotine dependence, cigarettes, uncomplicated: Secondary | ICD-10-CM | POA: Diagnosis not present

## 2018-03-21 MED ORDER — OXYCODONE-ACETAMINOPHEN 7.5-325 MG PO TABS: 1.0000 | ORAL_TABLET | Freq: Four times a day (QID) | ORAL | 0 refills | Status: DC | PRN

## 2018-03-21 NOTE — Progress Notes (Signed)
Patient ZO:XWRUE TASHAUN Villa, male DOB:Mar 25, 1976, 42 y.o. AVW:[id number]  Chief Complaint  Patient presents with  . Back Pain    HPI  Hayden Villa is a 42 y.o. male who has chronic pain of the lower back.  He has had some increased pain recently with weather changes. He has no new trauma, no weakness.  He is doing his exercises and taking his medicine. HPI  Body mass index is 41.5 kg/m.  ROS  Review of Systems  HENT: Negative for congestion.   Respiratory: Negative for cough and shortness of breath.   Cardiovascular: Negative for chest pain.  Endocrine: Positive for cold intolerance.  Musculoskeletal: Positive for arthralgias and back pain.  Allergic/Immunologic: Positive for environmental allergies.  All other systems reviewed and are negative.   Past Medical History:  Diagnosis Date  . Asthma   . Hypertension     Past Surgical History:  Procedure Laterality Date  . NO PAST SURGERIES      Family History  Problem Relation Age of Onset  . Hypertension Other     Social History Social History   Tobacco Use  . Smoking status: Current Every Day Smoker    Packs/day: 1.50    Years: 25.00    Pack years: 37.50    Types: Cigarettes  . Smokeless tobacco: Never Used  Substance Use Topics  . Alcohol use: Yes    Comment: rarely  . Drug use: Yes    Types: Marijuana    Comment: Patient denies    No Known Allergies  Current Outpatient Medications  Medication Sig Dispense Refill  . albuterol (VENTOLIN HFA) 108 (90 BASE) MCG/ACT inhaler Inhale 2 puffs into the lungs every 6 (six) hours as needed for wheezing or shortness of breath.     . oxyCODONE-acetaminophen (PERCOCET) 7.5-325 MG tablet Take 1 tablet by mouth every 6 (six) hours as needed for moderate pain or severe pain (Must last 30 days). 120 tablet 0  . labetalol (NORMODYNE) 100 MG tablet Take 100 mg by mouth 2 (two) times daily.     No current facility-administered medications for this visit.      Physical  Exam  Blood pressure 131/75, pulse 97, height 6' (1.829 m), weight (!) 306 lb (138.8 kg).  Constitutional: overall normal hygiene, normal nutrition, well developed, normal grooming, normal body habitus. Assistive device:none  Musculoskeletal: gait and station Limp none, muscle tone and strength are normal, no tremors or atrophy is present.  .  Neurological: coordination overall normal.  Deep tendon reflex/nerve stretch intact.  Sensation normal.  Cranial nerves II-XII intact.   Skin:   Normal overall no scars, lesions, ulcers or rashes. No psoriasis.  Psychiatric: Alert and oriented x 3.  Recent memory intact, remote memory unclear.  Normal mood and affect. Well groomed.  Good eye contact.  Cardiovascular: overall no swelling, no varicosities, no edema bilaterally, normal temperatures of the legs and arms, no clubbing, cyanosis and good capillary refill.  Lymphatic: palpation is normal.  Spine/Pelvis examination:  Inspection:  Overall, sacoiliac joint benign and hips nontender; without crepitus or defects.   Thoracic spine inspection: Alignment normal without kyphosis present   Lumbar spine inspection:  Alignment  with normal lumbar lordosis, without scoliosis apparent.   Thoracic spine palpation:  without tenderness of spinal processes   Lumbar spine palpation: without tenderness of lumbar area; without tightness of lumbar muscles    Range of Motion:   Lumbar flexion, forward flexion is normal without pain or tenderness  Lumbar extension is full without pain or tenderness   Left lateral bend is normal without pain or tenderness   Right lateral bend is normal without pain or tenderness   Straight leg raising is normal  Strength & tone: normal   Stability overall normal stability All other systems reviewed and are negative   The patient has been educated about the nature of the problem(s) and counseled on treatment options.  The patient appeared to understand what I have  discussed and is in agreement with it.  Encounter Diagnoses  Name Primary?  . Chronic midline low back pain without sciatica Yes  . Cigarette nicotine dependence without complication     PLAN Call if any problems.  Precautions discussed.  Continue current medications.   Return to clinic 3 months   I have reviewed the [facility] Controlled Substance Reporting System web site prior to prescribing narcotic medicine for this patient.  Electronically Signed Darreld Mclean, MD 5/16/20199:45 AM

## 2018-03-21 NOTE — Patient Instructions (Signed)
Steps to Quit Smoking Smoking tobacco can be bad for your health. It can also affect almost every organ in your body. Smoking puts you and people around you at risk for many serious long-lasting (chronic) diseases. Quitting smoking is hard, but it is one of the best things that you can do for your health. It is never too late to quit. What are the benefits of quitting smoking? When you quit smoking, you lower your risk for getting serious diseases and conditions. They can include:  Lung cancer or lung disease.  Heart disease.  Stroke.  Heart attack.  Not being able to have children (infertility).  Weak bones (osteoporosis) and broken bones (fractures).  If you have coughing, wheezing, and shortness of breath, those symptoms may get better when you quit. You may also get sick less often. If you are pregnant, quitting smoking can help to lower your chances of having a baby of low birth weight. What can I do to help me quit smoking? Talk with your doctor about what can help you quit smoking. Some things you can do (strategies) include:  Quitting smoking totally, instead of slowly cutting back how much you smoke over a period of time.  Going to in-person counseling. You are more likely to quit if you go to many counseling sessions.  Using resources and support systems, such as: ? Online chats with a counselor. ? Phone quitlines. ? Printed self-help materials. ? Support groups or group counseling. ? Text messaging programs. ? Mobile phone apps or applications.  Taking medicines. Some of these medicines may have nicotine in them. If you are pregnant or breastfeeding, do not take any medicines to quit smoking unless your doctor says it is okay. Talk with your doctor about counseling or other things that can help you.  Talk with your doctor about using more than one strategy at the same time, such as taking medicines while you are also going to in-person counseling. This can help make  quitting easier. What things can I do to make it easier to quit? Quitting smoking might feel very hard at first, but there is a lot that you can do to make it easier. Take these steps:  Talk to your family and friends. Ask them to support and encourage you.  Call phone quitlines, reach out to support groups, or work with a counselor.  Ask people who smoke to not smoke around you.  Avoid places that make you want (trigger) to smoke, such as: ? Bars. ? Parties. ? Smoke-break areas at work.  Spend time with people who do not smoke.  Lower the stress in your life. Stress can make you want to smoke. Try these things to help your stress: ? Getting regular exercise. ? Deep-breathing exercises. ? Yoga. ? Meditating. ? Doing a body scan. To do this, close your eyes, focus on one area of your body at a time from head to toe, and notice which parts of your body are tense. Try to relax the muscles in those areas.  Download or buy apps on your mobile phone or tablet that can help you stick to your quit plan. There are many free apps, such as QuitGuide from the CDC (Centers for Disease Control and Prevention). You can find more support from smokefree.gov and other websites.  This information is not intended to replace advice given to you by your health care provider. Make sure you discuss any questions you have with your health care provider. Document Released: 08/19/2009 Document   Revised: 06/20/2016 Document Reviewed: 03/09/2015 Elsevier Interactive Patient Education  2018 Elsevier Inc.  

## 2018-04-18 ENCOUNTER — Telehealth: Payer: Self-pay | Admitting: Orthopaedic Surgery

## 2018-04-18 MED ORDER — OXYCODONE-ACETAMINOPHEN 7.5-325 MG PO TABS: 1.0000 | ORAL_TABLET | Freq: Four times a day (QID) | ORAL | 0 refills | Status: DC | PRN

## 2018-04-18 NOTE — Telephone Encounter (Signed)
Oxycodone-Acetaminophen 7.5/325 mg Qty 120 Tablets  PATIENT USES [facility]

## 2018-04-25 DIAGNOSIS — R05 Cough: Secondary | ICD-10-CM | POA: Diagnosis not present

## 2018-04-25 DIAGNOSIS — R0602 Shortness of breath: Secondary | ICD-10-CM | POA: Diagnosis not present

## 2018-04-25 DIAGNOSIS — I10 Essential (primary) hypertension: Secondary | ICD-10-CM | POA: Diagnosis not present

## 2018-04-25 DIAGNOSIS — J45901 Unspecified asthma with (acute) exacerbation: Secondary | ICD-10-CM | POA: Diagnosis not present

## 2018-04-25 DIAGNOSIS — Z79899 Other long term (current) drug therapy: Secondary | ICD-10-CM | POA: Diagnosis not present

## 2018-04-25 DIAGNOSIS — Z87891 Personal history of nicotine dependence: Secondary | ICD-10-CM | POA: Diagnosis not present

## 2018-04-26 DIAGNOSIS — R0602 Shortness of breath: Secondary | ICD-10-CM | POA: Diagnosis not present

## 2018-04-26 DIAGNOSIS — R05 Cough: Secondary | ICD-10-CM | POA: Diagnosis not present

## 2018-05-16 ENCOUNTER — Telehealth: Payer: Self-pay | Admitting: Orthopaedic Surgery

## 2018-05-16 MED ORDER — OXYCODONE-ACETAMINOPHEN 7.5-325 MG PO TABS: 1.0000 | ORAL_TABLET | Freq: Four times a day (QID) | ORAL | 0 refills | Status: DC | PRN

## 2018-05-16 NOTE — Telephone Encounter (Signed)
Patient requests refill on Oxycodone/Acetaminophen 7.5-325  Mgs.  Qty  120       Sig: Take 1 tablet by mouth every 6 (six) hours as needed for moderate pain or severe pain (Must last 30 days).  [facility] [place]

## 2018-06-20 ENCOUNTER — Ambulatory Visit (INDEPENDENT_AMBULATORY_CARE_PROVIDER_SITE_OTHER): Payer: Medicare Other | Admitting: Orthopaedic Surgery

## 2018-06-20 ENCOUNTER — Encounter: Payer: Self-pay | Admitting: Orthopaedic Surgery

## 2018-06-20 VITALS — BP 142/86 | HR 90 | Ht 72.0 in | Wt 306.0 lb

## 2018-06-20 DIAGNOSIS — M545 Low back pain, unspecified: Secondary | ICD-10-CM

## 2018-06-20 DIAGNOSIS — F17210 Nicotine dependence, cigarettes, uncomplicated: Secondary | ICD-10-CM

## 2018-06-20 DIAGNOSIS — G8929 Other chronic pain: Secondary | ICD-10-CM

## 2018-06-20 MED ORDER — OXYCODONE-ACETAMINOPHEN 7.5-325 MG PO TABS: 1.0000 | ORAL_TABLET | Freq: Four times a day (QID) | ORAL | 0 refills | Status: DC | PRN

## 2018-06-20 NOTE — Progress Notes (Signed)
Patient UJ:WJXBJ:Hayden Villa, male DOB:31-Mar-1976, 42 y.o. [id number]  Chief Complaint  Patient presents with  . Back Pain    lumbar    HPI  Hayden Villa is a 42 y.o. male who has lower back pain.  He has no paresthesias.  He has no new trauma.  He has good and bad days.  He is doing his exercises and taking his medicine.  The patient was counseled about smoking and smoking cessation.  I spent about four and a half minutes  minutes in doing this.  I, of course, determined the patient does smoke and frequency.  The patient smokes  One and a half packs a day.  He has been up to three a day and has cut back.  I have advised the patient of problems medically that can occur with continued smoking including poor wound and bone healing as well as the obvious respiratory problems.  I have assessed the patient's willingness to cut back and quit smoking.  The patient has stated he is willing to stop but he knows it will take time.  I have told the patient to discuss with their family doctor also about smoking cessation.  I am willing to assist my patient in arranging this and to get additional help.I have suggested the patient have a set date to try to begin cutting back.  I have printed out a smoking cessation form about how to begin cutting back and suggestions.  I have recommended starting an immediate plan to cut back.  I will discuss this further when I see the patient in the future.  I have stressed that although this will be very difficult to accomplish, the benefits are very substantial and will give long term health benefits from now on.    Body mass index is 41.5 kg/m.  ROS  Review of Systems  All other systems reviewed and are negative.  The following is a summary of the past history medically, past history surgically, known current medicines, social history and family history.  This information is gathered electronically by the computer from prior information and documentation.  I  review this each visit and have found including this information at this point in the chart is beneficial and informative.    Past Medical History:  Diagnosis Date  . Asthma   . Hypertension     Past Surgical History:  Procedure Laterality Date  . NO PAST SURGERIES      Family History  Problem Relation Age of Onset  . Hypertension Other     Social History Social History   Tobacco Use  . Smoking status: Current Every Day Smoker    Packs/day: 1.50    Years: 25.00    Pack years: 37.50    Types: Cigarettes  . Smokeless tobacco: Never Used  Substance Use Topics  . Alcohol use: Yes    Comment: rarely  . Drug use: Yes    Types: Marijuana    Comment: Patient denies    No Known Allergies  Current Outpatient Medications  Medication Sig Dispense Refill  . albuterol (VENTOLIN HFA) 108 (90 BASE) MCG/ACT inhaler Inhale 2 puffs into the lungs every 6 (six) hours as needed for wheezing or shortness of breath.     . labetalol (NORMODYNE) 100 MG tablet Take 100 mg by mouth 2 (two) times daily.    Marland Kitchen. oxyCODONE-acetaminophen (PERCOCET) 7.5-325 MG tablet Take 1 tablet by mouth every 6 (six) hours as needed for moderate pain or severe pain (  Must last 30 days). 120 tablet 0   No current facility-administered medications for this visit.      Physical Exam  Blood pressure (!) 142/86, pulse 90, height 6' (1.829 m), weight (!) 306 lb (138.8 kg).  Constitutional: overall normal hygiene, normal nutrition, well developed, normal grooming, normal body habitus. Assistive device:none  Musculoskeletal: gait and station Limp none, muscle tone and strength are normal, no tremors or atrophy is present.  .  Neurological: coordination overall normal.  Deep tendon reflex/nerve stretch intact.  Sensation normal.  Cranial nerves II-XII intact.   Skin:   Normal overall no scars, lesions, ulcers or rashes. No psoriasis.  Psychiatric: Alert and oriented x 3.  Recent memory intact, remote memory  unclear.  Normal mood and affect. Well groomed.  Good eye contact.  Cardiovascular: overall no swelling, no varicosities, no edema bilaterally, normal temperatures of the legs and arms, no clubbing, cyanosis and good capillary refill.  Lymphatic: palpation is normal.  Spine/Pelvis examination:  Inspection:  Overall, sacoiliac joint benign and hips nontender; without crepitus or defects.   Thoracic spine inspection: Alignment normal without kyphosis present   Lumbar spine inspection:  Alignment  with normal lumbar lordosis, without scoliosis apparent.   Thoracic spine palpation:  without tenderness of spinal processes   Lumbar spine palpation: without tenderness of lumbar area; without tightness of lumbar muscles    Range of Motion:   Lumbar flexion, forward flexion is normal without pain or tenderness    Lumbar extension is full without pain or tenderness   Left lateral bend is normal without pain or tenderness   Right lateral bend is normal without pain or tenderness   Straight leg raising is normal  Strength & tone: normal   Stability overall normal stability All other systems reviewed and are negative   The patient has been educated about the nature of the problem(s) and counseled on treatment options.  The patient appeared to understand what I have discussed and is in agreement with it.  Encounter Diagnoses  Name Primary?  . Chronic midline low back pain without sciatica Yes  . Cigarette nicotine dependence without complication     PLAN Call if any problems.  Precautions discussed.  Continue current medications.   Return to clinic 3 months   I have reviewed the [facility] [place] Cont[place] Sub[place] Reporting System web site prior to prescribing narcotic medicine for this patient.  The patient has read and signed an Opioid Treatment Agreement which has been scanned and added to the medical record.  The patient understands the agreement and agrees to abide with  it.  The patient has chronic pain that is being treated with an opioid which relieves the pain.  The patient understands potential complications with chronic opioid treatment.  Electronically Signed Darreld McleanWayne Casilda Pickerill, MD 8/15/20193:36 PM

## 2018-06-20 NOTE — Patient Instructions (Signed)
Steps to Quit Smoking Smoking tobacco can be bad for your health. It can also affect almost every organ in your body. Smoking puts you and people around you at risk for many serious long-lasting (chronic) diseases. Quitting smoking is hard, but it is one of the best things that you can do for your health. It is never too late to quit. What are the benefits of quitting smoking? When you quit smoking, you lower your risk for getting serious diseases and conditions. They can include:  Lung cancer or lung disease.  Heart disease.  Stroke.  Heart attack.  Not being able to have children (infertility).  Weak bones (osteoporosis) and broken bones (fractures).  If you have coughing, wheezing, and shortness of breath, those symptoms may get better when you quit. You may also get sick less often. If you are pregnant, quitting smoking can help to lower your chances of having a baby of low birth weight. What can I do to help me quit smoking? Talk with your doctor about what can help you quit smoking. Some things you can do (strategies) include:  Quitting smoking totally, instead of slowly cutting back how much you smoke over a period of time.  Going to in-person counseling. You are more likely to quit if you go to many counseling sessions.  Using resources and support systems, such as: ? Online chats with a counselor. ? Phone quitlines. ? Printed self-help materials. ? Support groups or group counseling. ? Text messaging programs. ? Mobile phone apps or applications.  Taking medicines. Some of these medicines may have nicotine in them. If you are pregnant or breastfeeding, do not take any medicines to quit smoking unless your doctor says it is okay. Talk with your doctor about counseling or other things that can help you.  Talk with your doctor about using more than one strategy at the same time, such as taking medicines while you are also going to in-person counseling. This can help make  quitting easier. What things can I do to make it easier to quit? Quitting smoking might feel very hard at first, but there is a lot that you can do to make it easier. Take these steps:  Talk to your family and friends. Ask them to support and encourage you.  Call phone quitlines, reach out to support groups, or work with a counselor.  Ask people who smoke to not smoke around you.  Avoid places that make you want (trigger) to smoke, such as: ? Bars. ? Parties. ? Smoke-break areas at work.  Spend time with people who do not smoke.  Lower the stress in your life. Stress can make you want to smoke. Try these things to help your stress: ? Getting regular exercise. ? Deep-breathing exercises. ? Yoga. ? Meditating. ? Doing a body scan. To do this, close your eyes, focus on one area of your body at a time from head to toe, and notice which parts of your body are tense. Try to relax the muscles in those areas.  Download or buy apps on your mobile phone or tablet that can help you stick to your quit plan. There are many free apps, such as QuitGuide from the CDC (Centers for Disease Control and Prevention). You can find more support from smokefree.gov and other websites.  This information is not intended to replace advice given to you by your health care provider. Make sure you discuss any questions you have with your health care provider. Document Released: 08/19/2009 Document   Revised: 06/20/2016 Document Reviewed: 03/09/2015 Elsevier Interactive Patient Education  2018 Elsevier Inc.  

## 2018-07-18 ENCOUNTER — Telehealth: Payer: Self-pay | Admitting: Orthopaedic Surgery

## 2018-07-18 NOTE — Telephone Encounter (Signed)
Oxycodone-Acetaminophen 7.5/325 mg Qty  120 Tablets  PATIENT USES [facility] [place]

## 2018-07-22 MED ORDER — OXYCODONE-ACETAMINOPHEN 7.5-325 MG PO TABS: 1.0000 | ORAL_TABLET | Freq: Four times a day (QID) | ORAL | 0 refills | Status: DC | PRN

## 2018-08-19 ENCOUNTER — Telehealth: Payer: Self-pay | Admitting: Orthopaedic Surgery

## 2018-08-19 NOTE — Telephone Encounter (Signed)
Oxycodone-Acetaminophen 7.5/325 mg Qty  120 Tablets ° °PATIENT USES [facility] IN [place]  °

## 2018-08-20 MED ORDER — OXYCODONE-ACETAMINOPHEN 7.5-325 MG PO TABS: 1.0000 | ORAL_TABLET | Freq: Four times a day (QID) | ORAL | 0 refills | Status: DC | PRN

## 2018-09-19 ENCOUNTER — Encounter: Payer: Self-pay | Admitting: Orthopaedic Surgery

## 2018-09-19 ENCOUNTER — Ambulatory Visit (INDEPENDENT_AMBULATORY_CARE_PROVIDER_SITE_OTHER): Payer: Medicare Other | Admitting: Orthopaedic Surgery

## 2018-09-19 VITALS — BP 125/82 | HR 101 | Ht 72.0 in | Wt 290.0 lb

## 2018-09-19 DIAGNOSIS — M545 Low back pain, unspecified: Secondary | ICD-10-CM

## 2018-09-19 DIAGNOSIS — F17210 Nicotine dependence, cigarettes, uncomplicated: Secondary | ICD-10-CM

## 2018-09-19 DIAGNOSIS — G8929 Other chronic pain: Secondary | ICD-10-CM

## 2018-09-19 MED ORDER — OXYCODONE-ACETAMINOPHEN 7.5-325 MG PO TABS: 1.0000 | ORAL_TABLET | Freq: Four times a day (QID) | ORAL | 0 refills | Status: DC | PRN

## 2018-09-19 NOTE — Progress Notes (Signed)
[name], male DOB:[birth year], 42 y.o. YNW:[id number]  Chief Complaint  Patient presents with  . Back Pain    HPI  [facility] is a 42 y.o. male who has lower back pain, chronic. He has good and bad days.  The cold weather is more painful for him.  He has no new trauma, no weakness.  He is doing his exercises and taking his medicine.   Body mass index is 39.33 kg/m.  ROS  Review of Systems  HENT: Negative for congestion.   Respiratory: Negative for cough and shortness of breath.   Cardiovascular: Negative for chest pain.  Endocrine: Positive for cold intolerance.  Musculoskeletal: Positive for arthralgias and back pain.  Allergic/Immunologic: Positive for environmental allergies.  All other systems reviewed and are negative.   All other systems reviewed and are negative.  The following is a summary of the past history medically, past history surgically, known current medicines, social history and family history.  This information is gathered electronically by the computer from prior information and documentation.  I review this each visit and have found including this information at this point in the chart is beneficial and informative.    Past Medical History:  Diagnosis Date  . Asthma   . Hypertension     Past Surgical History:  Procedure Laterality Date  . NO PAST SURGERIES      Family History  Problem Relation Age of Onset  . Hypertension Other     Social History Social History   Tobacco Use  . Smoking status: Current Every Day Smoker    Packs/day: 1.50    Years: 25.00    Pack years: 37.50    Types: Cigarettes  . Smokeless tobacco: Never Used  Substance Use Topics  . Alcohol use: Yes    Comment: rarely  . Drug use: Yes    Types: Marijuana    Comment: Patient denies    No Known Allergies  Current Outpatient Medications  Medication Sig Dispense Refill  . labetalol (NORMODYNE) 100 MG tablet Take 100 mg by mouth 2 (two) times  daily.    Marland Kitchen oxyCODONE-acetaminophen (PERCOCET) 7.5-325 MG tablet Take 1 tablet by mouth every 6 (six) hours as needed for moderate pain or severe pain (Must last 30 days). 130 tablet 0  . albuterol (VENTOLIN HFA) 108 (90 BASE) MCG/ACT inhaler Inhale 2 puffs into the lungs every 6 (six) hours as needed for wheezing or shortness of breath.     . labetalol (NORMODYNE) 100 MG tablet Take 100 mg by mouth 2 (two) times daily.     No current facility-administered medications for this visit.      Physical Exam  Blood pressure 125/82, pulse (!) 101, height 6' (1.829 m), weight 290 lb (131.5 kg).  Constitutional: overall normal hygiene, normal nutrition, well developed, normal grooming, normal body habitus. Assistive device:none  Musculoskeletal: gait and station Limp none, muscle tone and strength are normal, no tremors or atrophy is present.  .  Neurological: coordination overall normal.  Deep tendon reflex/nerve stretch intact.  Sensation normal.  Cranial nerves II-XII intact.   Skin:   Normal overall no scars, lesions, ulcers or rashes. No psoriasis.  Psychiatric: Alert and oriented x 3.  Recent memory intact, remote memory unclear.  Normal mood and affect. Well groomed.  Good eye contact.  Cardiovascular: overall no swelling, no varicosities, no edema bilaterally, normal temperatures of the legs and arms, no clubbing, cyanosis and good capillary refill.  Lymphatic: palpation is normal.  Spine/Pelvis examination:  Inspection:  Overall, sacoiliac joint benign and hips nontender; without crepitus or defects.   Thoracic spine inspection: Alignment normal without kyphosis present   Lumbar spine inspection:  Alignment  with normal lumbar lordosis, without scoliosis apparent.   Thoracic spine palpation:  without tenderness of spinal processes   Lumbar spine palpation: without tenderness of lumbar area; without tightness of lumbar muscles    Range of Motion:   Lumbar flexion, forward  flexion is normal without pain or tenderness    Lumbar extension is full without pain or tenderness   Left lateral bend is normal without pain or tenderness   Right lateral bend is normal without pain or tenderness   Straight leg raising is normal  Strength & tone: normal   Stability overall normal stability All other systems reviewed and are negative   The patient has been educated about the nature of the problem(s) and counseled on treatment options.  The patient appeared to understand what I have discussed and is in agreement with it.  Encounter Diagnoses  Name Primary?  . Chronic midline low back pain without sciatica Yes  . Cigarette nicotine dependence without complication     PLAN Call if any problems.  Precautions discussed.  Continue current medications.   Return to clinic 3 months   I have reviewed the [facility] [place] Cont[place] Sub[place] web site prior to prescribing narcotic medicine for this patient.   The patient has read and signed an Opioid Treatment Agreement which has been scanned and added to the medical record.  The patient understands the agreement and agrees to abide with it.  The patient has chronic pain that is being treated with an opioid which relieves the pain.  The patient understands potential complications with chronic opioid treatment.   Electronically Signed Darreld McleanWayne Kamyla Olejnik, MD 11/14/201910:08 AM

## 2018-10-17 ENCOUNTER — Telehealth: Payer: Self-pay | Admitting: Orthopaedic Surgery

## 2018-10-17 NOTE — Telephone Encounter (Signed)
Oxycodone-Acetaminophen  7.5/325 mg    Qty 130 Tablets  PATIENT USES [facility]

## 2018-10-22 MED ORDER — OXYCODONE-ACETAMINOPHEN 7.5-325 MG PO TABS: 1.0000 | ORAL_TABLET | Freq: Four times a day (QID) | ORAL | 0 refills | Status: DC | PRN

## 2018-11-20 ENCOUNTER — Telehealth: Payer: Self-pay | Admitting: Orthopaedic Surgery

## 2018-11-20 MED ORDER — OXYCODONE-ACETAMINOPHEN 7.5-325 MG PO TABS: 1.0000 | ORAL_TABLET | Freq: Four times a day (QID) | ORAL | 0 refills | Status: DC | PRN

## 2018-11-20 NOTE — Telephone Encounter (Signed)
Patient requests refill on Oxycodone/Acetaminophen 7.5-325 mgs.   Qty  130  Sig: Take 1 tablet by mouth every 6 (six) hours as needed for moderate pain or severe pain (Must last 30 days).  Patient states he uses [facility]

## 2018-12-19 ENCOUNTER — Ambulatory Visit (INDEPENDENT_AMBULATORY_CARE_PROVIDER_SITE_OTHER): Payer: Medicare Other | Admitting: Orthopaedic Surgery

## 2018-12-19 ENCOUNTER — Encounter: Payer: Self-pay | Admitting: Orthopaedic Surgery

## 2018-12-19 VITALS — BP 120/77 | HR 89 | Ht 72.0 in | Wt 280.0 lb

## 2018-12-19 DIAGNOSIS — F17210 Nicotine dependence, cigarettes, uncomplicated: Secondary | ICD-10-CM | POA: Diagnosis not present

## 2018-12-19 DIAGNOSIS — G8929 Other chronic pain: Secondary | ICD-10-CM

## 2018-12-19 DIAGNOSIS — M545 Low back pain: Secondary | ICD-10-CM | POA: Diagnosis not present

## 2018-12-19 MED ORDER — OXYCODONE-ACETAMINOPHEN 7.5-325 MG PO TABS: 1.0000 | ORAL_TABLET | Freq: Four times a day (QID) | ORAL | 0 refills | Status: DC | PRN

## 2018-12-19 NOTE — Progress Notes (Signed)
Patient [name], male DOB:July 01, 1976, 43 y.o. [id number]  Chief Complaint  Patient presents with  . Back Pain    HPI  Hayden Villa is a 43 y.o. male who has lower back pain that is chronic.  He has more pain with the cold rainy weather we have been having. He has no new trauma, no weakness.  He has been doing his exercises.   Body mass index is 37.97 kg/m.  ROS  Review of Systems  HENT: Negative for congestion.   Respiratory: Negative for cough and shortness of breath.   Cardiovascular: Negative for chest pain.  Endocrine: Positive for cold intolerance.  Musculoskeletal: Positive for arthralgias and back pain.  Allergic/Immunologic: Positive for environmental allergies.  All other systems reviewed and are negative.   All other systems reviewed and are negative.  The following is a summary of the past history medically, past history surgically, known current medicines, social history and family history.  This information is gathered electronically by the computer from prior information and documentation.  I review this each visit and have found including this information at this point in the chart is beneficial and informative.    Past Medical History:  Diagnosis Date  . Asthma   . Hypertension     Past Surgical History:  Procedure Laterality Date  . NO PAST SURGERIES      Family History  Problem Relation Age of Onset  . Hypertension Other     Social History Social History   Tobacco Use  . Smoking status: Current Every Day Smoker    Packs/day: 1.50    Years: 25.00    Pack years: 37.50    Types: Cigarettes  . Smokeless tobacco: Never Used  Substance Use Topics  . Alcohol use: Yes    Comment: rarely  . Drug use: Yes    Types: Marijuana    Comment: Patient denies    No Known Allergies  Current Outpatient Medications  Medication Sig Dispense Refill  . albuterol (VENTOLIN HFA) 108 (90 BASE) MCG/ACT inhaler Inhale 2 puffs into the lungs  every 6 (six) hours as needed for wheezing or shortness of breath.     . labetalol (NORMODYNE) 100 MG tablet Take 100 mg by mouth 2 (two) times daily.    Marland Kitchen. oxyCODONE-acetaminophen (PERCOCET) 7.5-325 MG tablet Take 1 tablet by mouth every 6 (six) hours as needed for moderate pain or severe pain (Must last 30 days). 120 tablet 0   No current facility-administered medications for this visit.      Physical Exam  Blood pressure 120/77, pulse 89, height 6' (1.829 m), weight 280 lb (127 kg).  Constitutional: overall normal hygiene, normal nutrition, well developed, normal grooming, normal body habitus. Assistive device:none  Musculoskeletal: gait and station Limp none, muscle tone and strength are normal, no tremors or atrophy is present.  .  Neurological: coordination overall normal.  Deep tendon reflex/nerve stretch intact.  Sensation normal.  Cranial nerves II-XII intact.   Skin:   Normal overall no scars, lesions, ulcers or rashes. No psoriasis.  Psychiatric: Alert and oriented x 3.  Recent memory intact, remote memory unclear.  Normal mood and affect. Well groomed.  Good eye contact.  Cardiovascular: overall no swelling, no varicosities, no edema bilaterally, normal temperatures of the legs and arms, no clubbing, cyanosis and good capillary refill.  Lymphatic: palpation is normal.  Spine/Pelvis examination:  Inspection:  Overall, sacoiliac joint benign and hips nontender; without crepitus or defects.   Thoracic spine inspection:  Alignment normal without kyphosis present   Lumbar spine inspection:  Alignment  with normal lumbar lordosis, without scoliosis apparent.   Thoracic spine palpation:  without tenderness of spinal processes   Lumbar spine palpation: without tenderness of lumbar area; without tightness of lumbar muscles    Range of Motion:   Lumbar flexion, forward flexion is normal without pain or tenderness    Lumbar extension is full without pain or tenderness   Left  lateral bend is normal without pain or tenderness   Right lateral bend is normal without pain or tenderness   Straight leg raising is normal  Strength & tone: normal   Stability overall normal stability  All other systems reviewed and are negative   The patient has been educated about the nature of the problem(s) and counseled on treatment options.  The patient appeared to understand what I have discussed and is in agreement with it.  Encounter Diagnoses  Name Primary?  . Chronic midline low back pain without sciatica Yes  . Cigarette nicotine dependence without complication     PLAN Call if any problems.  Precautions discussed.  Continue current medications.   Return to clinic 3 months   The patient has read and signed an Opioid Treatment Agreement which has been scanned and added to the medical record.  The patient understands the agreement and agrees to abide with it.  The patient has chronic pain that is being treated with an opioid which relieves the pain.  The patient understands potential complications with chronic opioid treatment.   I have reviewed the [place] Controlled Substance Reporting System web site prior to prescribing narcotic medicine for this patient.    Electronically Signed Darreld McleanWayne Coy Rochford, MD 2/13/20209:54 AM

## 2019-01-14 ENCOUNTER — Telehealth: Payer: Self-pay | Admitting: Orthopaedic Surgery

## 2019-01-14 NOTE — Telephone Encounter (Signed)
Patient requests refill on Oxycodone/Acetaminophen 7.5-325  Mgs.   Qty  120  Sig: Take 1 tablet by mouth every 6 (six) hours as needed for moderate pain or severe pain (Must last 30 days).  Patient states he uses [facility]

## 2019-01-15 MED ORDER — OXYCODONE-ACETAMINOPHEN 7.5-325 MG PO TABS: 1.0000 | ORAL_TABLET | Freq: Four times a day (QID) | ORAL | 0 refills | Status: DC | PRN

## 2019-02-13 ENCOUNTER — Telehealth: Payer: Self-pay | Admitting: Orthopaedic Surgery

## 2019-02-13 MED ORDER — OXYCODONE-ACETAMINOPHEN 7.5-325 MG PO TABS: 1.0000 | ORAL_TABLET | Freq: Four times a day (QID) | ORAL | 0 refills | Status: DC | PRN

## 2019-02-13 NOTE — Telephone Encounter (Signed)
Oxycodone-Acetaminophen  7.5/325 mg Qty 110 Tablets ° °PATIENT USES [facility] °

## 2019-02-13 NOTE — Telephone Encounter (Signed)
Done per Dr Keeling. °

## 2019-03-13 ENCOUNTER — Telehealth: Payer: Self-pay | Admitting: Orthopaedic Surgery

## 2019-03-13 MED ORDER — OXYCODONE-ACETAMINOPHEN 7.5-325 MG PO TABS: 1.0000 | ORAL_TABLET | Freq: Four times a day (QID) | ORAL | 0 refills | Status: DC | PRN

## 2019-03-13 NOTE — Telephone Encounter (Signed)
Oxycodone-Acetaminophen  7.5/325 mg Qty 110 Tablets  PATIENT USES [facility]

## 2019-03-18 ENCOUNTER — Encounter: Payer: Self-pay | Admitting: Orthopaedic Surgery

## 2019-03-18 ENCOUNTER — Ambulatory Visit (INDEPENDENT_AMBULATORY_CARE_PROVIDER_SITE_OTHER): Payer: Medicare Other | Admitting: Orthopaedic Surgery

## 2019-03-18 ENCOUNTER — Other Ambulatory Visit: Payer: Self-pay

## 2019-03-18 VITALS — BP 150/93 | HR 96 | Temp 96.6°F | Ht 72.0 in | Wt 289.0 lb

## 2019-03-18 DIAGNOSIS — M545 Low back pain: Secondary | ICD-10-CM | POA: Diagnosis not present

## 2019-03-18 DIAGNOSIS — F17210 Nicotine dependence, cigarettes, uncomplicated: Secondary | ICD-10-CM | POA: Diagnosis not present

## 2019-03-18 DIAGNOSIS — G8929 Other chronic pain: Secondary | ICD-10-CM | POA: Diagnosis not present

## 2019-03-18 NOTE — Progress Notes (Signed)
[name], male DOB:1976/07/04, 43 y.o. JYN:[id number]  No chief complaint on file.   HPI  Hayden Villa is a 43 y.o. male who has lower back pain.  He has no new episodes of pain.  He has had some pain with the cold weather.  He is doing his exercises and taking his medicine.  He has no weakness.    Body mass index is 39.2 kg/m.  ROS  Review of Systems  HENT: Negative for congestion.   Respiratory: Negative for cough and shortness of breath.   Cardiovascular: Negative for chest pain.  Endocrine: Positive for cold intolerance.  Musculoskeletal: Positive for arthralgias and back pain.  Allergic/Immunologic: Positive for environmental allergies.  All other systems reviewed and are negative.   All other systems reviewed and are negative.  The following is a summary of the past history medically, past history surgically, known current medicines, social history and family history.  This information is gathered electronically by the computer from prior information and documentation.  I review this each visit and have found including this information at this point in the chart is beneficial and informative.    Past Medical History:  Diagnosis Date  . Asthma   . Hypertension     Past Surgical History:  Procedure Laterality Date  . NO PAST SURGERIES      Family History  Problem Relation Age of Onset  . Hypertension Other     Social History Social History   Tobacco Use  . Smoking status: Current Every Day Smoker    Packs/day: 1.50    Years: 25.00    Pack years: 37.50    Types: Cigarettes  . Smokeless tobacco: Never Used  Substance Use Topics  . Alcohol use: Yes    Comment: rarely  . Drug use: Yes    Types: Marijuana    Comment: Patient denies    No Known Allergies  Current Outpatient Medications  Medication Sig Dispense Refill  . albuterol (VENTOLIN HFA) 108 (90 BASE) MCG/ACT inhaler Inhale 2 puffs into the lungs every 6 (six) hours as needed  for wheezing or shortness of breath.     . labetalol (NORMODYNE) 100 MG tablet Take 100 mg by mouth 2 (two) times daily.    Marland Kitchen oxyCODONE-acetaminophen (PERCOCET) 7.5-325 MG tablet Take 1 tablet by mouth every 6 (six) hours as needed for moderate pain or severe pain (Must last 30 days). 110 tablet 0   No current facility-administered medications for this visit.      Physical Exam  Blood pressure (!) 150/93, pulse 96, temperature (!) 96.6 F (35.9 C), height 6' (1.829 m), weight 289 lb (131.1 kg).  Constitutional: overall normal hygiene, normal nutrition, well developed, normal grooming, normal body habitus. Assistive device:none  Musculoskeletal: gait and station Limp none, muscle tone and strength are normal, no tremors or atrophy is present.  .  Neurological: coordination overall normal.  Deep tendon reflex/nerve stretch intact.  Sensation normal.  Cranial nerves II-XII intact.   Skin:   Normal overall no scars, lesions, ulcers or rashes. No psoriasis.  Psychiatric: Alert and oriented x 3.  Recent memory intact, remote memory unclear.  Normal mood and affect. Well groomed.  Good eye contact.  Cardiovascular: overall no swelling, no varicosities, no edema bilaterally, normal temperatures of the legs and arms, no clubbing, cyanosis and good capillary refill.  Lymphatic: palpation is normal.  Spine/Pelvis examination:  Inspection:  Overall, sacoiliac joint benign and hips nontender; without crepitus or defects.  Thoracic spine inspection: Alignment normal without kyphosis present   Lumbar spine inspection:  Alignment  with normal lumbar lordosis, without scoliosis apparent.   Thoracic spine palpation:  without tenderness of spinal processes   Lumbar spine palpation: without tenderness of lumbar area; without tightness of lumbar muscles    Range of Motion:   Lumbar flexion, forward flexion is normal without pain or tenderness    Lumbar extension is full without pain or  tenderness   Left lateral bend is normal without pain or tenderness   Right lateral bend is normal without pain or tenderness   Straight leg raising is normal  Strength & tone: normal   Stability overall normal stability  All other systems reviewed and are negative   The patient has been educated about the nature of the problem(s) and counseled on treatment options.  The patient appeared to understand what I have discussed and is in agreement with it.  Encounter Diagnoses  Name Primary?  . Chronic midline low back pain without sciatica Yes  . Cigarette nicotine dependence without complication     PLAN Call if any problems.  Precautions discussed.  Continue current medications.   Return to clinic 3 months   The patient has read and signed an Opioid Treatment Agreement which has been scanned and added to the medical record.  The patient understands the agreement and agrees to abide with it.  The patient has chronic pain that is being treated with an opioid which relieves the pain.  The patient understands potential complications with chronic opioid treatment.   Electronically Signed Darreld McleanWayne Johnika Escareno, MD 5/12/20209:02 AM

## 2019-03-19 ENCOUNTER — Ambulatory Visit: Payer: Self-pay | Admitting: Orthopaedic Surgery

## 2019-04-10 ENCOUNTER — Telehealth: Payer: Self-pay | Admitting: Orthopaedic Surgery

## 2019-04-10 MED ORDER — OXYCODONE-ACETAMINOPHEN 7.5-325 MG PO TABS: 1.0000 | ORAL_TABLET | Freq: Four times a day (QID) | ORAL | 0 refills | Status: DC | PRN

## 2019-04-10 NOTE — Telephone Encounter (Signed)
Oxycodone-Acetaminophen  7.5/325 mg  Qty  110 Tablets  PATIENT USES [facility] IN [place]

## 2019-05-08 ENCOUNTER — Telehealth: Payer: Self-pay | Admitting: Orthopaedic Surgery

## 2019-05-08 MED ORDER — OXYCODONE-ACETAMINOPHEN 7.5-325 MG PO TABS: 1.0000 | ORAL_TABLET | Freq: Four times a day (QID) | ORAL | 0 refills | Status: DC | PRN

## 2019-05-08 NOTE — Telephone Encounter (Signed)
Patient called for refill:  oxyCODONE-acetaminophen (PERCOCET) 7.5-325 MG tablet 1 tablet Oral Every 6 hours PRN  -[facility]

## 2019-06-05 ENCOUNTER — Telehealth: Payer: Self-pay

## 2019-06-05 MED ORDER — OXYCODONE-ACETAMINOPHEN 7.5-325 MG PO TABS: 1.0000 | ORAL_TABLET | Freq: Four times a day (QID) | ORAL | 0 refills | Status: DC | PRN

## 2019-06-05 NOTE — Telephone Encounter (Signed)
Oxycodone-Acetaminophen 7.5/325 mg  Qty 110 Tablets  [facility]

## 2019-06-11 ENCOUNTER — Ambulatory Visit: Payer: Medicare Other | Admitting: Orthopaedic Surgery

## 2019-06-11 ENCOUNTER — Other Ambulatory Visit: Payer: Self-pay

## 2019-07-09 ENCOUNTER — Ambulatory Visit (INDEPENDENT_AMBULATORY_CARE_PROVIDER_SITE_OTHER): Payer: Medicare Other | Admitting: Orthopaedic Surgery

## 2019-07-09 ENCOUNTER — Encounter: Payer: Self-pay | Admitting: Orthopaedic Surgery

## 2019-07-09 ENCOUNTER — Other Ambulatory Visit: Payer: Self-pay

## 2019-07-09 VITALS — Ht 72.0 in | Wt 287.5 lb

## 2019-07-09 DIAGNOSIS — F17210 Nicotine dependence, cigarettes, uncomplicated: Secondary | ICD-10-CM

## 2019-07-09 DIAGNOSIS — G8929 Other chronic pain: Secondary | ICD-10-CM

## 2019-07-09 DIAGNOSIS — M545 Low back pain: Secondary | ICD-10-CM

## 2019-07-09 MED ORDER — OXYCODONE-ACETAMINOPHEN 7.5-325 MG PO TABS: 1.0000 | ORAL_TABLET | Freq: Four times a day (QID) | ORAL | 0 refills | Status: DC | PRN

## 2019-07-09 NOTE — Patient Instructions (Signed)

## 2019-07-09 NOTE — Progress Notes (Signed)
[name], male DOB:[birth year], 43 y.o. VPX:[id number]  Chief Complaint  Patient presents with  . Back Pain    Its about the same/hurting on pain scale it would be about a 9    HPI  Hayden Villa is a 43 y.o. male who has lower back pain.  He is stable.  He has good and bad days.  He has no trauma, no numbness.  He is doing his exercises and taking his medicine.   Body mass index is 38.99 kg/m.  ROS  Review of Systems  All other systems reviewed and are negative.  The following is a summary of the past history medically, past history surgically, known current medicines, social history and family history.  This information is gathered electronically by the computer from prior information and documentation.  I review this each visit and have found including this information at this point in the chart is beneficial and informative.    Past Medical History:  Diagnosis Date  . Asthma   . Hypertension     Past Surgical History:  Procedure Laterality Date  . NO PAST SURGERIES      Family History  Problem Relation Age of Onset  . Hypertension Other     Social History Social History   Tobacco Use  . Smoking status: Current Every Day Smoker    Packs/day: 1.50    Years: 25.00    Pack years: 37.50    Types: Cigarettes  . Smokeless tobacco: Never Used  Substance Use Topics  . Alcohol use: Yes    Comment: rarely  . Drug use: Yes    Types: Marijuana    Comment: Patient denies    No Known Allergies  Current Outpatient Medications  Medication Sig Dispense Refill  . albuterol (VENTOLIN HFA) 108 (90 BASE) MCG/ACT inhaler Inhale 2 puffs into the lungs every 6 (six) hours as needed for wheezing or shortness of breath.     . labetalol (NORMODYNE) 100 MG tablet Take 100 mg by mouth 2 (two) times daily.    Marland Kitchen oxyCODONE-acetaminophen (PERCOCET) 7.5-325 MG tablet Take 1 tablet by mouth every 6 (six) hours as needed for moderate pain or severe pain (Must last 30  days). 125 tablet 0   No current facility-administered medications for this visit.      Physical Exam  Height 6' (1.829 m), weight 287 lb 8 oz (130.4 kg).  Constitutional: overall normal hygiene, normal nutrition, well developed, normal grooming, normal body habitus. Assistive device:none  Musculoskeletal: gait and station Limp none, muscle tone and strength are normal, no tremors or atrophy is present.  .  Neurological: coordination overall normal.  Deep tendon reflex/nerve stretch intact.  Sensation normal.  Cranial nerves II-XII intact.   Skin:   Normal overall no scars, lesions, ulcers or rashes. No psoriasis.  Psychiatric: Alert and oriented x 3.  Recent memory intact, remote memory unclear.  Normal mood and affect. Well groomed.  Good eye contact.  Cardiovascular: overall no swelling, no varicosities, no edema bilaterally, normal temperatures of the legs and arms, no clubbing, cyanosis and good capillary refill.  Lymphatic: palpation is normal.  Spine/Pelvis examination:  Inspection:  Overall, sacoiliac joint benign and hips nontender; without crepitus or defects.   Thoracic spine inspection: Alignment normal without kyphosis present   Lumbar spine inspection:  Alignment  with normal lumbar lordosis, without scoliosis apparent.   Thoracic spine palpation:  without tenderness of spinal processes   Lumbar spine palpation: without tenderness of lumbar area;  without tightness of lumbar muscles    Range of Motion:   Lumbar flexion, forward flexion is normal without pain or tenderness    Lumbar extension is full without pain or tenderness   Left lateral bend is normal without pain or tenderness   Right lateral bend is normal without pain or tenderness   Straight leg raising is normal  Strength & tone: normal   Stability overall normal stability  All other systems reviewed and are negative   The patient has been educated about the nature of the problem(s) and counseled  on treatment options.  The patient appeared to understand what I have discussed and is in agreement with it.  Encounter Diagnoses  Name Primary?  . Chronic midline low back pain without sciatica Yes  . Cigarette nicotine dependence without complication      PLAN Call if any problems.  Precautions discussed.  Continue current medications.   Return to clinic 3 months   I have reviewed the [facility] Controlled Substance Reporting System web site prior to prescribing narcotic medicine for this patient.   The patient has read and signed an Opioid Treatment Agreement which has been scanned and added to the medical record.  The patient understands the agreement and agrees to abide with it.  The patient has chronic pain that is being treated with an opioid which relieves the pain.  The patient understands potential complications with chronic opioid treatment.   Electronically Signed Darreld McleanWayne Daaiyah Baumert, MD 9/2/20209:02 AM

## 2019-08-06 ENCOUNTER — Other Ambulatory Visit: Payer: Self-pay | Admitting: Orthopaedic Surgery

## 2019-08-06 NOTE — Telephone Encounter (Signed)
Patient called for refill - aware that while Dr Luna Glasgow is out of clinic that Dr Aline Brochure is reviewing refill requests. Medication: oxyCODONE-acetaminophen (PERCOCET) 7.5-325 MG tablet 125 tablet  [place]

## 2019-08-11 MED ORDER — OXYCODONE-ACETAMINOPHEN 7.5-325 MG PO TABS: 1.0000 | ORAL_TABLET | Freq: Four times a day (QID) | ORAL | 0 refills | Status: DC | PRN

## 2019-08-11 NOTE — Telephone Encounter (Signed)
This patient has been waiting on a refill since last week.  Do you think Dr. Aline Brochure could do this for him?

## 2019-09-09 ENCOUNTER — Telehealth: Payer: Self-pay | Admitting: Orthopaedic Surgery

## 2019-09-09 MED ORDER — OXYCODONE-ACETAMINOPHEN 7.5-325 MG PO TABS: 1.0000 | ORAL_TABLET | Freq: Four times a day (QID) | ORAL | 0 refills | Status: DC | PRN

## 2019-09-09 NOTE — Telephone Encounter (Signed)
Patient requests refill on Oxycodone/Acetaminophen 7.5-325  Mgs.  Qty  125  Sig: Take 1 tablet by mouth every 6 (six) hours as needed for moderate pain or severe pain (Must last 30 days).  Patient states he uses [facility]

## 2019-09-09 NOTE — Telephone Encounter (Signed)
Lanny Hurst said that due to [facility] restrictions patient will need to find another pharmacy to fill his Percocet prescription. Lanny Hurst stated he would fill prescription this time and he would let the patient know also.

## 2019-09-09 NOTE — Telephone Encounter (Signed)
Lanny Hurst from [facility] calling to verify patient's pain medication refill sent by Dr Luna Glasgow today (on line)

## 2019-09-22 DIAGNOSIS — M7989 Other specified soft tissue disorders: Secondary | ICD-10-CM | POA: Diagnosis not present

## 2019-09-22 DIAGNOSIS — J45909 Unspecified asthma, uncomplicated: Secondary | ICD-10-CM | POA: Diagnosis not present

## 2019-09-22 DIAGNOSIS — Z202 Contact with and (suspected) exposure to infections with a predominantly sexual mode of transmission: Secondary | ICD-10-CM | POA: Diagnosis not present

## 2019-09-22 DIAGNOSIS — Z720 Tobacco use: Secondary | ICD-10-CM | POA: Diagnosis not present

## 2019-09-22 DIAGNOSIS — F329 Major depressive disorder, single episode, unspecified: Secondary | ICD-10-CM | POA: Diagnosis not present

## 2019-09-22 DIAGNOSIS — Z6838 Body mass index (BMI) 38.0-38.9, adult: Secondary | ICD-10-CM | POA: Diagnosis not present

## 2019-09-27 DIAGNOSIS — R079 Chest pain, unspecified: Secondary | ICD-10-CM | POA: Diagnosis not present

## 2019-09-27 DIAGNOSIS — Z87891 Personal history of nicotine dependence: Secondary | ICD-10-CM | POA: Diagnosis not present

## 2019-09-27 DIAGNOSIS — I10 Essential (primary) hypertension: Secondary | ICD-10-CM | POA: Diagnosis not present

## 2019-09-27 DIAGNOSIS — Z209 Contact with and (suspected) exposure to unspecified communicable disease: Secondary | ICD-10-CM | POA: Diagnosis not present

## 2019-09-27 DIAGNOSIS — K2100 Gastro-esophageal reflux disease with esophagitis, without bleeding: Secondary | ICD-10-CM | POA: Diagnosis not present

## 2019-09-27 DIAGNOSIS — M791 Myalgia, unspecified site: Secondary | ICD-10-CM | POA: Diagnosis not present

## 2019-09-27 DIAGNOSIS — J9809 Other diseases of bronchus, not elsewhere classified: Secondary | ICD-10-CM | POA: Diagnosis not present

## 2019-09-27 DIAGNOSIS — J4521 Mild intermittent asthma with (acute) exacerbation: Secondary | ICD-10-CM | POA: Diagnosis not present

## 2019-10-01 ENCOUNTER — Telehealth: Payer: Self-pay

## 2019-10-01 ENCOUNTER — Encounter: Payer: Self-pay | Admitting: Orthopaedic Surgery

## 2019-10-01 ENCOUNTER — Ambulatory Visit (INDEPENDENT_AMBULATORY_CARE_PROVIDER_SITE_OTHER): Payer: Medicare Other | Admitting: Orthopaedic Surgery

## 2019-10-01 ENCOUNTER — Other Ambulatory Visit: Payer: Self-pay

## 2019-10-01 VITALS — BP 141/113 | HR 60 | Ht 72.0 in | Wt 287.1 lb

## 2019-10-01 DIAGNOSIS — M545 Low back pain, unspecified: Secondary | ICD-10-CM

## 2019-10-01 DIAGNOSIS — F17210 Nicotine dependence, cigarettes, uncomplicated: Secondary | ICD-10-CM

## 2019-10-01 DIAGNOSIS — G8929 Other chronic pain: Secondary | ICD-10-CM

## 2019-10-01 MED ORDER — OXYCODONE-ACETAMINOPHEN 7.5-325 MG PO TABS: 1.0000 | ORAL_TABLET | Freq: Four times a day (QID) | ORAL | 0 refills | Status: DC | PRN

## 2019-10-01 MED ORDER — CYCLOBENZAPRINE HCL 10 MG PO TABS: 10.0000 mg | ORAL_TABLET | Freq: Every day | ORAL | 0 refills | Status: DC

## 2019-10-01 NOTE — Progress Notes (Signed)
Patient XB:JYNWG Hayden Villa, male DOB:05/05/1976, 43 y.o. [id number]  Chief Complaint  Patient presents with  . Back Pain    Its hurting/ in alot of pain    HPI  Hayden Villa is a 43 y.o. male who has lower back pain.  He is having more pain recently with the colder weather.  He has do to a lot of lifting at work.  He has no numbness, no weakness.  He is active and doing his exercises and taking his medicine.  I will add Flexeril for this next month.   Body mass index is 38.94 kg/m.  ROS  Review of Systems  All other systems reviewed and are negative.  The following is a summary of the past history medically, past history surgically, known current medicines, social history and family history.  This information is gathered electronically by the computer from prior information and documentation.  I review this each visit and have found including this information at this point in the chart is beneficial and informative.    Past Medical History:  Diagnosis Date  . Asthma   . Hypertension     Past Surgical History:  Procedure Laterality Date  . NO PAST SURGERIES      Family History  Problem Relation Age of Onset  . Hypertension Other     Social History Social History   Tobacco Use  . Smoking status: Current Every Day Smoker    Packs/day: 1.50    Years: 25.00    Pack years: 37.50    Types: Cigarettes  . Smokeless tobacco: Never Used  Substance Use Topics  . Alcohol use: Yes    Comment: rarely  . Drug use: Yes    Types: Marijuana    Comment: Patient denies    No Known Allergies  Current Outpatient Medications  Medication Sig Dispense Refill  . albuterol (VENTOLIN HFA) 108 (90 BASE) MCG/ACT inhaler Inhale 2 puffs into the lungs every 6 (six) hours as needed for wheezing or shortness of breath.     . cyclobenzaprine (FLEXERIL) 10 MG tablet Take 1 tablet (10 mg total) by mouth at bedtime. One tablet every night at bedtime as needed for spasm. 30 tablet 0  .  labetalol (NORMODYNE) 100 MG tablet Take 100 mg by mouth 2 (two) times daily.    Marland Kitchen oxyCODONE-acetaminophen (PERCOCET) 7.5-325 MG tablet Take 1 tablet by mouth every 6 (six) hours as needed for moderate pain or severe pain (Must last 30 days). 125 tablet 0   No current facility-administered medications for this visit.      Physical Exam  Blood pressure (!) 141/113, pulse 60, height 6' (1.829 m), weight 287 lb 2 oz (130.2 kg).  Constitutional: overall normal hygiene, normal nutrition, well developed, normal grooming, normal body habitus. Assistive device:none  Musculoskeletal: gait and station Limp none, muscle tone and strength are normal, no tremors or atrophy is present.  .  Neurological: coordination overall normal.  Deep tendon reflex/nerve stretch intact.  Sensation normal.  Cranial nerves II-XII intact.   Skin:   Normal overall no scars, lesions, ulcers or rashes. No psoriasis.  Psychiatric: Alert and oriented x 3.  Recent memory intact, remote memory unclear.  Normal mood and affect. Well groomed.  Good eye contact.  Cardiovascular: overall no swelling, no varicosities, no edema bilaterally, normal temperatures of the legs and arms, no clubbing, cyanosis and good capillary refill.  Spine/Pelvis examination:  Inspection:  Overall, sacoiliac joint benign and hips nontender; without crepitus or defects.  Thoracic spine inspection: Alignment normal without kyphosis present   Lumbar spine inspection:  Alignment  with normal lumbar lordosis, without scoliosis apparent.   Thoracic spine palpation:  without tenderness of spinal processes   Lumbar spine palpation: without tenderness of lumbar area; without tightness of lumbar muscles    Range of Motion:   Lumbar flexion, forward flexion is normal without pain or tenderness    Lumbar extension is full without pain or tenderness   Left lateral bend is normal without pain or tenderness   Right lateral bend is normal without pain or  tenderness   Straight leg raising is normal  Strength & tone: normal   Stability overall normal stability  Lymphatic: palpation is normal.  All other systems reviewed and are negative   The patient has been educated about the nature of the problem(s) and counseled on treatment options.  The patient appeared to understand what I have discussed and is in agreement with it.  Encounter Diagnoses  Name Primary?  . Chronic midline low back pain without sciatica Yes  . Cigarette nicotine dependence without complication     PLAN Call if any problems.  Precautions discussed.  Continue current medications. Begin Flexeril.  Return to clinic 3 months   I have reviewed the [facility] web site prior to prescribing narcotic medicine for this patient.   Electronically Signed Sanjuana Kava, MD 11/25/20209:31 AM

## 2019-10-01 NOTE — Patient Instructions (Signed)

## 2019-10-01 NOTE — Telephone Encounter (Signed)
Oxycodone-acetaminophen 7.5/325 mg  Qty 125 Tablets  [facility] [place]

## 2019-11-03 ENCOUNTER — Telehealth: Payer: Self-pay | Admitting: Orthopaedic Surgery

## 2019-11-03 MED ORDER — OXYCODONE-ACETAMINOPHEN 7.5-325 MG PO TABS: 1.0000 | ORAL_TABLET | Freq: Four times a day (QID) | ORAL | 0 refills | Status: DC | PRN

## 2019-11-03 NOTE — Telephone Encounter (Signed)
Patient called for refill: oxyCODONE-acetaminophen (PERCOCET) 7.5-325 MG tablet 125 tablet  [facility] [place]  

## 2019-12-02 ENCOUNTER — Telehealth: Payer: Self-pay | Admitting: Orthopaedic Surgery

## 2019-12-02 MED ORDER — OXYCODONE-ACETAMINOPHEN 7.5-325 MG PO TABS: 1.0000 | ORAL_TABLET | Freq: Four times a day (QID) | ORAL | 0 refills | Status: DC | PRN

## 2019-12-02 NOTE — Telephone Encounter (Signed)
Patient called for refill: oxyCODONE-acetaminophen (PERCOCET) 7.5-325 MG tablet 125 tablet  [facility], [place]

## 2019-12-24 ENCOUNTER — Encounter: Payer: Self-pay | Admitting: Orthopaedic Surgery

## 2019-12-24 ENCOUNTER — Ambulatory Visit (INDEPENDENT_AMBULATORY_CARE_PROVIDER_SITE_OTHER): Payer: Medicare Other | Admitting: Orthopaedic Surgery

## 2019-12-24 ENCOUNTER — Other Ambulatory Visit: Payer: Self-pay

## 2019-12-24 VITALS — BP 140/110 | HR 61 | Ht 72.0 in | Wt 287.2 lb

## 2019-12-24 DIAGNOSIS — F17210 Nicotine dependence, cigarettes, uncomplicated: Secondary | ICD-10-CM | POA: Diagnosis not present

## 2019-12-24 DIAGNOSIS — M545 Low back pain: Secondary | ICD-10-CM | POA: Diagnosis not present

## 2019-12-24 DIAGNOSIS — G8929 Other chronic pain: Secondary | ICD-10-CM

## 2019-12-24 MED ORDER — OXYCODONE-ACETAMINOPHEN 7.5-325 MG PO TABS: 1.0000 | ORAL_TABLET | Freq: Four times a day (QID) | ORAL | 0 refills | Status: DC | PRN

## 2019-12-24 NOTE — Patient Instructions (Signed)

## 2019-12-24 NOTE — Progress Notes (Signed)
[name], male DOB:1976-04-23, 44 y.o. [id number]  Chief Complaint  Patient presents with  . Back Pain    its hurting    HPI  Hayden Villa is a 44 y.o. male who has chronic lower back pain in the midline.  He has no new trauma.  He has no weakness. He is doing his exercises and taking his medicine.  He is working full time.   Body mass index is 38.96 kg/m.  ROS  Review of Systems  HENT: Negative for congestion.   Respiratory: Negative for cough and shortness of breath.   Cardiovascular: Negative for chest pain.  Endocrine: Positive for cold intolerance.  Musculoskeletal: Positive for arthralgias and back pain.  Allergic/Immunologic: Positive for environmental allergies.  All other systems reviewed and are negative.   All other systems reviewed and are negative.  The following is a summary of the past history medically, past history surgically, known current medicines, social history and family history.  This information is gathered electronically by the computer from prior information and documentation.  I review this each visit and have found including this information at this point in the chart is beneficial and informative.    Past Medical History:  Diagnosis Date  . Asthma   . Hypertension     Past Surgical History:  Procedure Laterality Date  . NO PAST SURGERIES      Family History  Problem Relation Age of Onset  . Hypertension Other     Social History Social History   Tobacco Use  . Smoking status: Current Every Day Smoker    Packs/day: 1.50    Years: 25.00    Pack years: 37.50    Types: Cigarettes  . Smokeless tobacco: Never Used  Substance Use Topics  . Alcohol use: Yes    Comment: rarely  . Drug use: Yes    Types: Marijuana    Comment: Patient denies    No Known Allergies  Current Outpatient Medications  Medication Sig Dispense Refill  . albuterol (VENTOLIN HFA) 108 (90 BASE) MCG/ACT inhaler Inhale 2 puffs into the  lungs every 6 (six) hours as needed for wheezing or shortness of breath.     . cyclobenzaprine (FLEXERIL) 10 MG tablet Take 1 tablet (10 mg total) by mouth at bedtime. One tablet every night at bedtime as needed for spasm. 30 tablet 0  . labetalol (NORMODYNE) 100 MG tablet Take 100 mg by mouth 2 (two) times daily.    Marland Kitchen oxyCODONE-acetaminophen (PERCOCET) 7.5-325 MG tablet Take 1 tablet by mouth every 6 (six) hours as needed for moderate pain or severe pain (Must last 30 days). 120 tablet 0   No current facility-administered medications for this visit.     Physical Exam  Blood pressure (!) 140/110, pulse 61, height 6' (1.829 m), weight 287 lb 4 oz (130.3 kg).  Constitutional: overall normal hygiene, normal nutrition, well developed, normal grooming, normal body habitus. Assistive device:none  Musculoskeletal: gait and station Limp none, muscle tone and strength are normal, no tremors or atrophy is present.  .  Neurological: coordination overall normal.  Deep tendon reflex/nerve stretch intact.  Sensation normal.  Cranial nerves II-XII intact.   Skin:   Normal overall no scars, lesions, ulcers or rashes. No psoriasis.  Psychiatric: Alert and oriented x 3.  Recent memory intact, remote memory unclear.  Normal mood and affect. Well groomed.  Good eye contact.  Cardiovascular: overall no swelling, no varicosities, no edema bilaterally, normal temperatures of the legs  and arms, no clubbing, cyanosis and good capillary refill.  Lymphatic: palpation is normal.  Spine/Pelvis examination:  Inspection:  Overall, sacoiliac joint benign and hips nontender; without crepitus or defects.   Thoracic spine inspection: Alignment normal without kyphosis present   Lumbar spine inspection:  Alignment  with normal lumbar lordosis, without scoliosis apparent.   Thoracic spine palpation:  without tenderness of spinal processes   Lumbar spine palpation: without tenderness of lumbar area; without tightness  of lumbar muscles    Range of Motion:   Lumbar flexion, forward flexion is normal without pain or tenderness    Lumbar extension is full without pain or tenderness   Left lateral bend is normal without pain or tenderness   Right lateral bend is normal without pain or tenderness   Straight leg raising is normal  Strength & tone: normal   Stability overall normal stability  All other systems reviewed and are negative   The patient has been educated about the nature of the problem(s) and counseled on treatment options.  The patient appeared to understand what I have discussed and is in agreement with it.  Encounter Diagnoses  Name Primary?  . Chronic midline low back pain without sciatica Yes  . Cigarette nicotine dependence without complication     PLAN Call if any problems.  Precautions discussed.  Continue current medications.   Return to clinic 3 months   I have reviewed the [facility] Controlled Substance Reporting System web site prior to prescribing narcotic medicine for this patient.   Electronically Signed Darreld Mclean, MD 2/17/20219:01 AM

## 2020-01-26 ENCOUNTER — Telehealth: Payer: Self-pay | Admitting: Orthopaedic Surgery

## 2020-01-26 MED ORDER — OXYCODONE-ACETAMINOPHEN 7.5-325 MG PO TABS: 1.0000 | ORAL_TABLET | Freq: Four times a day (QID) | ORAL | 0 refills | Status: DC | PRN

## 2020-01-26 NOTE — Telephone Encounter (Signed)
Patient called for refill: oxyCODONE-acetaminophen (PERCOCET) 7.5-325 MG tablet 120 tablet  -[facility] [place]

## 2020-02-24 ENCOUNTER — Telehealth: Payer: Self-pay | Admitting: Orthopaedic Surgery

## 2020-02-24 MED ORDER — OXYCODONE-ACETAMINOPHEN 7.5-325 MG PO TABS: 1.0000 | ORAL_TABLET | Freq: Four times a day (QID) | ORAL | 0 refills | Status: DC | PRN

## 2020-02-24 NOTE — Telephone Encounter (Signed)
Patient requests refill on Oxycodone/Acetaminophen 7.5-325  Mgs.  Qty  15  Sig: Take 1 tablet by mouth every 6 (six) hours as needed for moderate pain or severe pain (Must last 30 days).       [facility] [place]

## 2020-03-17 ENCOUNTER — Ambulatory Visit (INDEPENDENT_AMBULATORY_CARE_PROVIDER_SITE_OTHER): Payer: Medicare Other | Admitting: Orthopaedic Surgery

## 2020-03-17 ENCOUNTER — Encounter: Payer: Self-pay | Admitting: Orthopaedic Surgery

## 2020-03-17 ENCOUNTER — Other Ambulatory Visit: Payer: Self-pay

## 2020-03-17 VITALS — BP 164/121 | HR 89 | Ht 66.0 in | Wt 296.0 lb

## 2020-03-17 DIAGNOSIS — F17210 Nicotine dependence, cigarettes, uncomplicated: Secondary | ICD-10-CM | POA: Diagnosis not present

## 2020-03-17 DIAGNOSIS — Z6841 Body Mass Index (BMI) 40.0 and over, adult: Secondary | ICD-10-CM | POA: Diagnosis not present

## 2020-03-17 DIAGNOSIS — M545 Low back pain: Secondary | ICD-10-CM | POA: Diagnosis not present

## 2020-03-17 DIAGNOSIS — G8929 Other chronic pain: Secondary | ICD-10-CM

## 2020-03-17 MED ORDER — CYCLOBENZAPRINE HCL 10 MG PO TABS: 10.0000 mg | ORAL_TABLET | Freq: Every day | ORAL | 0 refills | Status: DC

## 2020-03-17 NOTE — Patient Instructions (Signed)

## 2020-03-17 NOTE — Progress Notes (Signed)
[name], male DOB:[birth year], 44 y.o. Hayden Villa  Chief Complaint  Patient presents with  . Back Pain    Hurting today pain level is a 10    HPI  Hayden Villa is a 44 y.o. male who has lower back pain.  He has had increase in pain over the last week.  He has no weakness, no numbness.  His pain is centralized to the lower back.  He has no new trauma.  He is out of his Flexeril and I will renew today.  He is doing his exercises at home.   Body mass index is 47.78 kg/m.  The patient meets the AMA guidelines for Morbid (severe) obesity with a BMI > 40.0 and I have recommended weight loss.   ROS  Review of Systems  HENT: Negative for congestion.   Respiratory: Negative for cough and shortness of breath.   Cardiovascular: Negative for chest pain.  Endocrine: Positive for cold intolerance.  Musculoskeletal: Positive for arthralgias and back pain.  Allergic/Immunologic: Positive for environmental allergies.  All other systems reviewed and are negative.   All other systems reviewed and are negative.  The following is a summary of the past history medically, past history surgically, known current medicines, social history and family history.  This information is gathered electronically by the computer from prior information and documentation.  I review this each visit and have found including this information at this point in the chart is beneficial and informative.    Past Medical History:  Diagnosis Date  . Asthma   . Hypertension     Past Surgical History:  Procedure Laterality Date  . NO PAST SURGERIES      Family History  Problem Relation Age of Onset  . Hypertension Other     Social History Social History   Tobacco Use  . Smoking status: Current Every Day Smoker    Packs/day: 1.50    Years: 25.00    Pack years: 37.50    Types: Cigarettes  . Smokeless tobacco: Never Used  Substance Use Topics  . Alcohol use: Yes    Comment: rarely  .  Drug use: Yes    Types: Marijuana    Comment: Patient denies    No Known Allergies  Current Outpatient Medications  Medication Sig Dispense Refill  . albuterol (VENTOLIN HFA) 108 (90 BASE) MCG/ACT inhaler Inhale 2 puffs into the lungs every 6 (six) hours as needed for wheezing or shortness of breath.     . cyclobenzaprine (FLEXERIL) 10 MG tablet Take 1 tablet (10 mg total) by mouth at bedtime. One tablet every night at bedtime as needed for spasm. 30 tablet 0  . labetalol (NORMODYNE) 100 MG tablet Take 100 mg by mouth 2 (two) times daily.    Marland Kitchen oxyCODONE-acetaminophen (PERCOCET) 7.5-325 MG tablet Take 1 tablet by mouth every 6 (six) hours as needed for moderate pain or severe pain (Must last 30 days). 110 tablet 0   No current facility-administered medications for this visit.     Physical Exam  Blood pressure (!) 164/121, pulse 89, height 5\' 6"  (1.676 m), weight 296 lb (134.3 kg).  Constitutional: overall normal hygiene, normal nutrition, well developed, normal grooming, normal body habitus. Assistive device:none  Musculoskeletal: gait and station Limp none, muscle tone and strength are normal, no tremors or atrophy is present.  .  Neurological: coordination overall normal.  Deep tendon reflex/nerve stretch intact.  Sensation normal.  Cranial nerves II-XII intact.   Skin:  Normal overall no scars, lesions, ulcers or rashes. No psoriasis.  Psychiatric: Alert and oriented x 3.  Recent memory intact, remote memory unclear.  Normal mood and affect. Well groomed.  Good eye contact.  Cardiovascular: overall no swelling, no varicosities, no edema bilaterally, normal temperatures of the legs and arms, no clubbing, cyanosis and good capillary refill.  Spine/Pelvis examination:  Inspection:  Overall, sacoiliac joint benign and hips nontender; without crepitus or defects.   Thoracic spine inspection: Alignment normal without kyphosis present   Lumbar spine inspection:  Alignment  with  normal lumbar lordosis, without scoliosis apparent.   Thoracic spine palpation:  without tenderness of spinal processes   Lumbar spine palpation: without tenderness of lumbar area; without tightness of lumbar muscles    Range of Motion:   Lumbar flexion, forward flexion is normal without pain or tenderness    Lumbar extension is full without pain or tenderness   Left lateral bend is normal without pain or tenderness   Right lateral bend is normal without pain or tenderness   Straight leg raising is normal  Strength & tone: normal   Stability overall normal stability  Lymphatic: palpation is normal.  All other systems reviewed and are negative   The patient has been educated about the nature of the problem(s) and counseled on treatment options.  The patient appeared to understand what I have discussed and is in agreement with it.  Encounter Diagnoses  Name Primary?  . Chronic midline low back pain without sciatica Yes  . Cigarette nicotine dependence without complication   . Body mass index 45.0-49.9, adult ([place])   . Morbid obesity ([place])     PLAN Call if any problems.  Precautions discussed.  Continue current medications.   Return to clinic 3 months   Electronically Signed Sanjuana Kava, MD 5/12/20219:58 AM

## 2020-03-23 ENCOUNTER — Telehealth: Payer: Self-pay | Admitting: Orthopaedic Surgery

## 2020-03-23 MED ORDER — OXYCODONE-ACETAMINOPHEN 7.5-325 MG PO TABS: 1.0000 | ORAL_TABLET | Freq: Four times a day (QID) | ORAL | 0 refills | Status: DC | PRN

## 2020-03-23 NOTE — Telephone Encounter (Signed)
Patient requests refill on Oxyodone/Acetaminophen 7.5-325 Mgs. Qty 110  Sig:Take 1 tablet by mouth every 6 (six) hours as needed for moderate pain or severe pain (Must last 30 days).  [facility] [place] 

## 2020-04-09 NOTE — Telephone Encounter (Signed)
Error

## 2020-04-20 ENCOUNTER — Telehealth: Payer: Self-pay | Admitting: Orthopaedic Surgery

## 2020-04-20 MED ORDER — OXYCODONE-ACETAMINOPHEN 7.5-325 MG PO TABS: 1.0000 | ORAL_TABLET | Freq: Four times a day (QID) | ORAL | 0 refills | Status: DC | PRN

## 2020-04-20 NOTE — Telephone Encounter (Signed)
Patient requests refill on Oxyodone/Acetaminophen 7.5-325 Mgs. Qty 110  Sig:Take 1 tablet by mouth every 6 (six) hours as needed for moderate pain or severe pain (Must last 30 days).  [facility] [place] 

## 2020-05-18 ENCOUNTER — Telehealth: Payer: Self-pay | Admitting: Orthopaedic Surgery

## 2020-05-18 MED ORDER — OXYCODONE-ACETAMINOPHEN 7.5-325 MG PO TABS: 1.0000 | ORAL_TABLET | Freq: Four times a day (QID) | ORAL | 0 refills | Status: DC | PRN

## 2020-05-18 NOTE — Telephone Encounter (Signed)
Patient called for refill: oxyCODONE-acetaminophen (PERCOCET) 7.5-325 MG tablet 110 tablet  -[facility] [place]

## 2020-06-16 ENCOUNTER — Telehealth: Payer: Self-pay | Admitting: Orthopaedic Surgery

## 2020-06-16 NOTE — Telephone Encounter (Signed)
Patient requests refill on   oxyCODONE-acetaminophen (PERCOCET) 7.5-325 MG tablet 110 tablet  -[facility] [place]

## 2020-06-17 MED ORDER — OXYCODONE-ACETAMINOPHEN 7.5-325 MG PO TABS: 1.0000 | ORAL_TABLET | Freq: Four times a day (QID) | ORAL | 0 refills | Status: DC | PRN

## 2020-06-23 ENCOUNTER — Ambulatory Visit: Payer: Medicare Other | Admitting: Orthopaedic Surgery

## 2020-06-23 ENCOUNTER — Other Ambulatory Visit: Payer: Self-pay

## 2020-07-07 ENCOUNTER — Ambulatory Visit: Payer: Medicare Other | Admitting: Orthopaedic Surgery

## 2020-07-07 ENCOUNTER — Other Ambulatory Visit: Payer: Self-pay

## 2020-07-08 ENCOUNTER — Encounter: Payer: Self-pay | Admitting: Orthopaedic Surgery

## 2020-07-13 ENCOUNTER — Other Ambulatory Visit: Payer: Self-pay

## 2020-07-13 ENCOUNTER — Encounter: Payer: Self-pay | Admitting: Orthopaedic Surgery

## 2020-07-13 ENCOUNTER — Ambulatory Visit (INDEPENDENT_AMBULATORY_CARE_PROVIDER_SITE_OTHER): Payer: Medicare Other | Admitting: Orthopaedic Surgery

## 2020-07-13 VITALS — BP 141/93 | HR 93 | Ht 72.0 in | Wt 305.0 lb

## 2020-07-13 DIAGNOSIS — G8929 Other chronic pain: Secondary | ICD-10-CM | POA: Diagnosis not present

## 2020-07-13 DIAGNOSIS — M545 Low back pain: Secondary | ICD-10-CM

## 2020-07-13 DIAGNOSIS — F17210 Nicotine dependence, cigarettes, uncomplicated: Secondary | ICD-10-CM

## 2020-07-13 DIAGNOSIS — Z6841 Body Mass Index (BMI) 40.0 and over, adult: Secondary | ICD-10-CM | POA: Diagnosis not present

## 2020-07-13 MED ORDER — OXYCODONE-ACETAMINOPHEN 7.5-325 MG PO TABS: 1.0000 | ORAL_TABLET | Freq: Four times a day (QID) | ORAL | 0 refills | Status: DC | PRN

## 2020-07-13 NOTE — Progress Notes (Signed)
[name], male DOB:Jul 15, 1976, 44 y.o. [id number]  Chief Complaint  Patient presents with  . Back Pain    HPI  Hayden Villa is a 44 y.o. male who has chronic lower back pain. He has no new trauma, no weakness. He has better days during the summer months.   Body mass index is 41.37 kg/m.  The patient meets the AMA guidelines for Morbid (severe) obesity with a BMI > 40.0 and I have recommended weight loss.    ROS  Review of Systems  HENT: Negative for congestion.   Respiratory: Negative for cough and shortness of breath.   Cardiovascular: Negative for chest pain.  Endocrine: Positive for cold intolerance.  Musculoskeletal: Positive for arthralgias and back pain.  Allergic/Immunologic: Positive for environmental allergies.  All other systems reviewed and are negative.   All other systems reviewed and are negative.  The following is a summary of the past history medically, past history surgically, known current medicines, social history and family history.  This information is gathered electronically by the computer from prior information and documentation.  I review this each visit and have found including this information at this point in the chart is beneficial and informative.    Past Medical History:  Diagnosis Date  . Asthma   . Hypertension     Past Surgical History:  Procedure Laterality Date  . NO PAST SURGERIES      Family History  Problem Relation Age of Onset  . Hypertension Other     Social History Social History   Tobacco Use  . Smoking status: Current Every Day Smoker    Packs/day: 1.50    Years: 25.00    Pack years: 37.50    Types: Cigarettes  . Smokeless tobacco: Never Used  Substance Use Topics  . Alcohol use: Yes    Comment: rarely  . Drug use: Yes    Types: Marijuana    Comment: Patient denies    No Known Allergies  Current Outpatient Medications  Medication Sig Dispense Refill  . albuterol (VENTOLIN HFA)  108 (90 BASE) MCG/ACT inhaler Inhale 2 puffs into the lungs every 6 (six) hours as needed for wheezing or shortness of breath.     . cyclobenzaprine (FLEXERIL) 10 MG tablet Take 1 tablet (10 mg total) by mouth at bedtime. One tablet every night at bedtime as needed for spasm. 30 tablet 0  . labetalol (NORMODYNE) 100 MG tablet Take 100 mg by mouth 2 (two) times daily.    Marland Kitchen oxyCODONE-acetaminophen (PERCOCET) 7.5-325 MG tablet Take 1 tablet by mouth every 6 (six) hours as needed for moderate pain or severe pain (Must last 30 days). 110 tablet 0   No current facility-administered medications for this visit.     Physical Exam  Blood pressure (!) 141/93, pulse 93, height 6' (1.829 m), weight (!) 305 lb (138.3 kg).  Constitutional: overall normal hygiene, normal nutrition, well developed, normal grooming, normal body habitus. Assistive device:none  Musculoskeletal: gait and station Limp none, muscle tone and strength are normal, no tremors or atrophy is present.  .  Neurological: coordination overall normal.  Deep tendon reflex/nerve stretch intact.  Sensation normal.  Cranial nerves II-XII intact.   Skin:   Normal overall no scars, lesions, ulcers or rashes. No psoriasis.  Psychiatric: Alert and oriented x 3.  Recent memory intact, remote memory unclear.  Normal mood and affect. Well groomed.  Good eye contact.  Cardiovascular: overall no swelling, no varicosities, no edema bilaterally, normal  temperatures of the legs and arms, no clubbing, cyanosis and good capillary refill.  Lymphatic: palpation is normal.  Spine/Pelvis examination:  Inspection:  Overall, sacoiliac joint benign and hips nontender; without crepitus or defects.   Thoracic spine inspection: Alignment normal without kyphosis present   Lumbar spine inspection:  Alignment  with normal lumbar lordosis, without scoliosis apparent.   Thoracic spine palpation:  without tenderness of spinal processes   Lumbar spine palpation:  without tenderness of lumbar area; without tightness of lumbar muscles    Range of Motion:   Lumbar flexion, forward flexion is normal without pain or tenderness    Lumbar extension is full without pain or tenderness   Left lateral bend is normal without pain or tenderness   Right lateral bend is normal without pain or tenderness   Straight leg raising is normal  Strength & tone: normal   Stability overall normal stability All other systems reviewed and are negative   The patient has been educated about the nature of the problem(s) and counseled on treatment options.  The patient appeared to understand what I have discussed and is in agreement with it.  Encounter Diagnoses  Name Primary?  . Chronic midline low back pain without sciatica Yes  . Cigarette nicotine dependence without complication   . Body mass index 45.0-49.9, adult ([facility])   . Morbid obesity ([facility])     PLAN Call if any problems.  Precautions discussed.  Continue current medications.   Return to clinic 3 months   I have reviewed the [facility] Controlled Substance Reporting System web site prior to prescribing narcotic medicine for this patient.   Electronically Signed Darreld Mclean, MD 9/7/20218:56 AM

## 2020-08-04 ENCOUNTER — Other Ambulatory Visit: Payer: Self-pay

## 2020-08-04 ENCOUNTER — Ambulatory Visit: Payer: Medicare Other | Admitting: Orthopaedic Surgery

## 2020-08-10 ENCOUNTER — Telehealth: Payer: Self-pay | Admitting: Orthopaedic Surgery

## 2020-08-10 MED ORDER — OXYCODONE-ACETAMINOPHEN 7.5-325 MG PO TABS: 1.0000 | ORAL_TABLET | Freq: Four times a day (QID) | ORAL | 0 refills | Status: DC | PRN

## 2020-08-10 NOTE — Telephone Encounter (Signed)
Patient called for refill: oxyCODONE-acetaminophen (PERCOCET) 7.5-325 MG tablet 110 tablet  -[facility] [place]

## 2020-09-08 ENCOUNTER — Telehealth: Payer: Self-pay | Admitting: Orthopaedic Surgery

## 2020-09-08 NOTE — Telephone Encounter (Signed)
Patient requests refill on Oxyodone/Acetaminophen 7.5-325 Mgs. Qty 110  SFS:ELTR 1 tablet by mouth every 6 (six) hours as needed for moderate pain or severe pain (Must last 30 days).  [facility] [place]

## 2020-09-09 MED ORDER — OXYCODONE-ACETAMINOPHEN 7.5-325 MG PO TABS: 1.0000 | ORAL_TABLET | Freq: Four times a day (QID) | ORAL | 0 refills | Status: DC | PRN

## 2020-10-06 ENCOUNTER — Telehealth: Payer: Self-pay | Admitting: Orthopaedic Surgery

## 2020-10-06 MED ORDER — OXYCODONE-ACETAMINOPHEN 7.5-325 MG PO TABS: 1.0000 | ORAL_TABLET | Freq: Four times a day (QID) | ORAL | 0 refills | Status: DC | PRN

## 2020-10-06 NOTE — Telephone Encounter (Signed)
Patient requests refill on Oxyodone/Acetaminophen 7.5-325 Mgs. Qty 110  Sig:Take 1 tablet by mouth every 6 (six) hours as needed for moderate pain or severe pain (Must last 30 days).  [facility] [place] 

## 2020-10-13 ENCOUNTER — Ambulatory Visit: Payer: Medicare Other | Admitting: Orthopaedic Surgery

## 2020-10-13 ENCOUNTER — Other Ambulatory Visit: Payer: Self-pay

## 2020-10-14 ENCOUNTER — Ambulatory Visit: Payer: Medicare Other | Admitting: Orthopaedic Surgery

## 2020-10-19 ENCOUNTER — Other Ambulatory Visit: Payer: Self-pay

## 2020-10-19 ENCOUNTER — Encounter: Payer: Self-pay | Admitting: Orthopaedic Surgery

## 2020-10-19 ENCOUNTER — Ambulatory Visit (INDEPENDENT_AMBULATORY_CARE_PROVIDER_SITE_OTHER): Payer: Medicare Other | Admitting: Orthopaedic Surgery

## 2020-10-19 VITALS — BP 151/90 | HR 95 | Ht 72.0 in | Wt 312.0 lb

## 2020-10-19 DIAGNOSIS — G8929 Other chronic pain: Secondary | ICD-10-CM | POA: Diagnosis not present

## 2020-10-19 DIAGNOSIS — Z6841 Body Mass Index (BMI) 40.0 and over, adult: Secondary | ICD-10-CM | POA: Diagnosis not present

## 2020-10-19 DIAGNOSIS — F17210 Nicotine dependence, cigarettes, uncomplicated: Secondary | ICD-10-CM

## 2020-10-19 DIAGNOSIS — M545 Low back pain, unspecified: Secondary | ICD-10-CM | POA: Diagnosis not present

## 2020-10-19 NOTE — Patient Instructions (Signed)
Steps to Quit Smoking Smoking tobacco is the leading cause of preventable death. It can affect almost every organ in the body. Smoking puts you and people around you at risk for many serious, long-lasting (chronic) diseases. Quitting smoking can be hard, but it is one of the best things that you can do for your health. It is never too late to quit. How do I get ready to quit? When you decide to quit smoking, make a plan to help you succeed. Before you quit:  Pick a date to quit. Set a date within the next 2 weeks to give you time to prepare.  Write down the reasons why you are quitting. Keep this list in places where you will see it often.  Tell your family, friends, and co-workers that you are quitting. Their support is important.  Talk with your doctor about the choices that may help you quit.  Find out if your health insurance will pay for these treatments.  Know the people, places, things, and activities that make you want to smoke (triggers). Avoid them. What first steps can I take to quit smoking?  Throw away all cigarettes at home, at work, and in your car.  Throw away the things that you use when you smoke, such as ashtrays and lighters.  Clean your car. Make sure to empty the ashtray.  Clean your home, including curtains and carpets. What can I do to help me quit smoking? Talk with your doctor about taking medicines and seeing a counselor at the same time. You are more likely to succeed when you do both.  If you are pregnant or breastfeeding, talk with your doctor about counseling or other ways to quit smoking. Do not take medicine to help you quit smoking unless your doctor tells you to do so. To quit smoking: Quit right away  Quit smoking totally, instead of slowly cutting back on how much you smoke over a period of time.  Go to counseling. You are more likely to quit if you go to counseling sessions regularly. Take medicine You may take medicines to help you quit. Some  medicines need a prescription, and some you can buy over-the-counter. Some medicines may contain a drug called nicotine to replace the nicotine in cigarettes. Medicines may:  Help you to stop having the desire to smoke (cravings).  Help to stop the problems that come when you stop smoking (withdrawal symptoms). Your doctor may ask you to use:  Nicotine patches, gum, or lozenges.  Nicotine inhalers or sprays.  Non-nicotine medicine that is taken by mouth. Find resources Find resources and other ways to help you quit smoking and remain smoke-free after you quit. These resources are most helpful when you use them often. They include:  Online chats with a counselor.  Phone quitlines.  Printed self-help materials.  Support groups or group counseling.  Text messaging programs.  Mobile phone apps. Use apps on your mobile phone or tablet that can help you stick to your quit plan. There are many free apps for mobile phones and tablets as well as websites. Examples include Quit Guide from the CDC and smokefree.gov  What things can I do to make it easier to quit?   Talk to your family and friends. Ask them to support and encourage you.  Call a phone quitline (1-800-QUIT-NOW), reach out to support groups, or work with a counselor.  Ask people who smoke to not smoke around you.  Avoid places that make you want to smoke,   such as: ? Bars. ? Parties. ? Smoke-break areas at work.  Spend time with people who do not smoke.  Lower the stress in your life. Stress can make you want to smoke. Try these things to help your stress: ? Getting regular exercise. ? Doing deep-breathing exercises. ? Doing yoga. ? Meditating. ? Doing a body scan. To do this, close your eyes, focus on one area of your body at a time from head to toe. Notice which parts of your body are tense. Try to relax the muscles in those areas. How will I feel when I quit smoking? Day 1 to 3 weeks Within the first 24 hours,  you may start to have some problems that come from quitting tobacco. These problems are very bad 2-3 days after you quit, but they do not often last for more than 2-3 weeks. You may get these symptoms:  Mood swings.  Feeling restless, nervous, angry, or annoyed.  Trouble concentrating.  Dizziness.  Strong desire for high-sugar foods and nicotine.  Weight gain.  Trouble pooping (constipation).  Feeling like you may vomit (nausea).  Coughing or a sore throat.  Changes in how the medicines that you take for other issues work in your body.  Depression.  Trouble sleeping (insomnia). Week 3 and afterward After the first 2-3 weeks of quitting, you may start to notice more positive results, such as:  Better sense of smell and taste.  Less coughing and sore throat.  Slower heart rate.  Lower blood pressure.  Clearer skin.  Better breathing.  Fewer sick days. Quitting smoking can be hard. Do not give up if you fail the first time. Some people need to try a few times before they succeed. Do your best to stick to your quit plan, and talk with your doctor if you have any questions or concerns. Summary  Smoking tobacco is the leading cause of preventable death. Quitting smoking can be hard, but it is one of the best things that you can do for your health.  When you decide to quit smoking, make a plan to help you succeed.  Quit smoking right away, not slowly over a period of time.  When you start quitting, seek help from your doctor, family, or friends. This information is not intended to replace advice given to you by your health care provider. Make sure you discuss any questions you have with your health care provider. Document Revised: 07/18/2019 Document Reviewed: 01/11/2019 Elsevier Patient Education  2020 Elsevier Inc.  

## 2020-10-19 NOTE — Progress Notes (Signed)
Patient ZO:XWRUE KORIE Villa, male DOB:May 13, 1976, 44 y.o. [id number]  Chief Complaint  Patient presents with   Back Pain    Lower back pain, 9/10 today.      HPI  Hayden Villa is a 44 y.o. male who has lower back pain.  He has more pain with cold weather.  He is taking his medicine and doing his exercises.  He has no weakness.   Body mass index is 42.31 kg/m.  The patient meets the AMA guidelines for Morbid (severe) obesity with a BMI > 40.0 and I have recommended weight loss.   ROS  Review of Systems  HENT: Negative for congestion.   Respiratory: Negative for cough and shortness of breath.   Cardiovascular: Negative for chest pain.  Endocrine: Positive for cold intolerance.  Musculoskeletal: Positive for arthralgias and back pain.  Allergic/Immunologic: Positive for environmental allergies.  All other systems reviewed and are negative.   All other systems reviewed and are negative.  The following is a summary of the past history medically, past history surgically, known current medicines, social history and family history.  This information is gathered electronically by the computer from prior information and documentation.  I review this each visit and have found including this information at this point in the chart is beneficial and informative.    Past Medical History:  Diagnosis Date   Asthma    Hypertension     Past Surgical History:  Procedure Laterality Date   NO PAST SURGERIES      Family History  Problem Relation Age of Onset   Hypertension Other     Social History Social History   Tobacco Use   Smoking status: Current Every Day Smoker    Packs/day: 1.50    Years: 25.00    Pack years: 37.50    Types: Cigarettes   Smokeless tobacco: Never Used  Substance Use Topics   Alcohol use: Yes    Comment: rarely   Drug use: Yes    Types: Marijuana    Comment: Patient denies    No Known Allergies  Current Outpatient Medications   Medication Sig Dispense Refill   albuterol (VENTOLIN HFA) 108 (90 Base) MCG/ACT inhaler Inhale 2 puffs into the lungs every 6 (six) hours as needed for wheezing or shortness of breath.      cyclobenzaprine (FLEXERIL) 10 MG tablet Take 1 tablet (10 mg total) by mouth at bedtime. One tablet every night at bedtime as needed for spasm. 30 tablet 0   labetalol (NORMODYNE) 100 MG tablet Take 100 mg by mouth 2 (two) times daily.     oxyCODONE-acetaminophen (PERCOCET) 7.5-325 MG tablet Take 1 tablet by mouth every 6 (six) hours as needed for moderate pain or severe pain (Must last 30 days). 110 tablet 0   No current facility-administered medications for this visit.     Physical Exam  Blood pressure (!) 151/90, pulse 95, height 6' (1.829 m), weight (!) 312 lb (141.5 kg).  Constitutional: overall normal hygiene, normal nutrition, well developed, normal grooming, normal body habitus. Assistive device:none  Musculoskeletal: gait and station Limp none, muscle tone and strength are normal, no tremors or atrophy is present.  .  Neurological: coordination overall normal.  Deep tendon reflex/nerve stretch intact.  Sensation normal.  Cranial nerves II-XII intact.   Skin:   Normal overall no scars, lesions, ulcers or rashes. No psoriasis.  Psychiatric: Alert and oriented x 3.  Recent memory intact, remote memory unclear.  Normal mood and affect. Well  groomed.  Good eye contact.  Cardiovascular: overall no swelling, no varicosities, no edema bilaterally, normal temperatures of the legs and arms, no clubbing, cyanosis and good capillary refill.  Lymphatic: palpation is normal.  Spine/Pelvis examination:  Inspection:  Overall, sacoiliac joint benign and hips nontender; without crepitus or defects.   Thoracic spine inspection: Alignment normal without kyphosis present   Lumbar spine inspection:  Alignment  with normal lumbar lordosis, without scoliosis apparent.   Thoracic spine palpation:   without tenderness of spinal processes   Lumbar spine palpation: without tenderness of lumbar area; without tightness of lumbar muscles    Range of Motion:   Lumbar flexion, forward flexion is normal without pain or tenderness    Lumbar extension is full without pain or tenderness   Left lateral bend is normal without pain or tenderness   Right lateral bend is normal without pain or tenderness   Straight leg raising is normal  Strength & tone: normal   Stability overall normal stability  All other systems reviewed and are negative   The patient has been educated about the nature of the problem(s) and counseled on treatment options.  The patient appeared to understand what I have discussed and is in agreement with it.  Encounter Diagnoses  Name Primary?   Chronic midline low back pain without sciatica Yes   Cigarette nicotine dependence without complication    Body mass index 45.0-49.9, adult (HCC)    Morbid obesity (HCC)     PLAN Call if any problems.  Precautions discussed.  Continue current medications.   Return to clinic 3 months   Electronically Signed Darreld Mclean, MD 12/14/202111:15 AM

## 2020-10-20 ENCOUNTER — Ambulatory Visit: Payer: Medicare Other | Admitting: Orthopaedic Surgery

## 2020-11-04 ENCOUNTER — Other Ambulatory Visit: Payer: Self-pay

## 2020-11-04 ENCOUNTER — Other Ambulatory Visit: Payer: Self-pay | Admitting: Orthopedic Surgery

## 2020-11-04 MED ORDER — OXYCODONE-ACETAMINOPHEN 7.5-325 MG PO TABS: 1.0000 | ORAL_TABLET | Freq: Four times a day (QID) | ORAL | 0 refills | Status: DC | PRN

## 2020-12-02 ENCOUNTER — Telehealth: Payer: Self-pay | Admitting: Orthopaedic Surgery

## 2020-12-02 MED ORDER — OXYCODONE-ACETAMINOPHEN 7.5-325 MG PO TABS: 1.0000 | ORAL_TABLET | Freq: Four times a day (QID) | ORAL | 0 refills | Status: DC | PRN

## 2020-12-02 NOTE — Telephone Encounter (Signed)
Patient called and request refill on meds  oxyCODONE-acetaminophen (PERCOCET) 7.5-325 MG tablet  Pharmacy:  [facility]  

## 2020-12-30 ENCOUNTER — Telehealth: Payer: Self-pay | Admitting: Orthopaedic Surgery

## 2020-12-30 MED ORDER — OXYCODONE-ACETAMINOPHEN 7.5-325 MG PO TABS: 1.0000 | ORAL_TABLET | Freq: Four times a day (QID) | ORAL | 0 refills | Status: DC | PRN

## 2020-12-30 NOTE — Telephone Encounter (Signed)
Patient called and request refill on meds  oxyCODONE-acetaminophen (PERCOCET) 7.5-325 MG tablet  Pharmacy:  [facility]

## 2021-01-18 ENCOUNTER — Encounter: Payer: Self-pay | Admitting: Orthopaedic Surgery

## 2021-01-18 ENCOUNTER — Ambulatory Visit (INDEPENDENT_AMBULATORY_CARE_PROVIDER_SITE_OTHER): Payer: Medicare Other | Admitting: Orthopaedic Surgery

## 2021-01-18 ENCOUNTER — Other Ambulatory Visit: Payer: Self-pay

## 2021-01-18 VITALS — BP 136/88 | HR 89 | Ht 72.0 in | Wt 312.0 lb

## 2021-01-18 DIAGNOSIS — Z6841 Body Mass Index (BMI) 40.0 and over, adult: Secondary | ICD-10-CM | POA: Diagnosis not present

## 2021-01-18 DIAGNOSIS — M545 Low back pain, unspecified: Secondary | ICD-10-CM | POA: Diagnosis not present

## 2021-01-18 DIAGNOSIS — F17210 Nicotine dependence, cigarettes, uncomplicated: Secondary | ICD-10-CM | POA: Diagnosis not present

## 2021-01-18 DIAGNOSIS — G8929 Other chronic pain: Secondary | ICD-10-CM | POA: Diagnosis not present

## 2021-01-18 NOTE — Progress Notes (Signed)
[name], male DOB:Jun 07, 1976, 45 y.o. [id number]  Chief Complaint  Patient presents with  . Back Pain    Back pain    HPI  Hayden Villa is a 45 y.o. male who has chronic lower back pain with good and bad days.  He has no new trauma. He has no weakness.  He is doing his exercises.  He is taking his medicine.     Body mass index is 42.31 kg/m.  The patient meets the AMA guidelines for Morbid (severe) obesity with a BMI > 40.0 and I have recommended weight loss.   ROS  Review of Systems  HENT: Negative for congestion.   Respiratory: Negative for cough and shortness of breath.   Cardiovascular: Negative for chest pain.  Endocrine: Positive for cold intolerance.  Musculoskeletal: Positive for arthralgias and back pain.  Allergic/Immunologic: Positive for environmental allergies.  All other systems reviewed and are negative.   All other systems reviewed and are negative.  The following is a summary of the past history medically, past history surgically, known current medicines, social history and family history.  This information is gathered electronically by the computer from prior information and documentation.  I review this each visit and have found including this information at this point in the chart is beneficial and informative.    Past Medical History:  Diagnosis Date  . Asthma   . Hypertension     Past Surgical History:  Procedure Laterality Date  . NO PAST SURGERIES      Family History  Problem Relation Age of Onset  . Hypertension Other     Social History Social History   Tobacco Use  . Smoking status: Current Every Day Smoker    Packs/day: 1.50    Years: 25.00    Pack years: 37.50    Types: Cigarettes  . Smokeless tobacco: Never Used  Substance Use Topics  . Alcohol use: Yes    Comment: rarely  . Drug use: Yes    Types: Marijuana    Comment: Patient denies    No Known Allergies  Current Outpatient Medications   Medication Sig Dispense Refill  . albuterol (VENTOLIN HFA) 108 (90 Base) MCG/ACT inhaler Inhale 2 puffs into the lungs every 6 (six) hours as needed for wheezing or shortness of breath.     . cyclobenzaprine (FLEXERIL) 10 MG tablet Take 1 tablet (10 mg total) by mouth at bedtime. One tablet every night at bedtime as needed for spasm. 30 tablet 0  . labetalol (NORMODYNE) 100 MG tablet Take 100 mg by mouth 2 (two) times daily.    Marland Kitchen oxyCODONE-acetaminophen (PERCOCET) 7.5-325 MG tablet Take 1 tablet by mouth every 6 (six) hours as needed for moderate pain or severe pain (Must last 30 days). 110 tablet 0   No current facility-administered medications for this visit.     Physical Exam  Blood pressure 136/88, pulse 89, height 6' (1.829 m), weight (!) 312 lb (141.5 kg).  Constitutional: overall normal hygiene, normal nutrition, well developed, normal grooming, normal body habitus. Assistive device:none  Musculoskeletal: gait and station Limp none, muscle tone and strength are normal, no tremors or atrophy is present.  .  Neurological: coordination overall normal.  Deep tendon reflex/nerve stretch intact.  Sensation normal.  Cranial nerves II-XII intact.   Skin:   Normal overall no scars, lesions, ulcers or rashes. No psoriasis.  Psychiatric: Alert and oriented x 3.  Recent memory intact, remote memory unclear.  Normal mood and affect.  Well groomed.  Good eye contact.  Cardiovascular: overall no swelling, no varicosities, no edema bilaterally, normal temperatures of the legs and arms, no clubbing, cyanosis and good capillary refill.  Lymphatic: palpation is normal.  Spine/Pelvis examination:  Inspection:  Overall, sacoiliac joint benign and hips nontender; without crepitus or defects.   Thoracic spine inspection: Alignment normal without kyphosis present   Lumbar spine inspection:  Alignment  with normal lumbar lordosis, without scoliosis apparent.   Thoracic spine palpation:  without  tenderness of spinal processes   Lumbar spine palpation: without tenderness of lumbar area; without tightness of lumbar muscles    Range of Motion:   Lumbar flexion, forward flexion is normal without pain or tenderness    Lumbar extension is full without pain or tenderness   Left lateral bend is normal without pain or tenderness   Right lateral bend is normal without pain or tenderness   Straight leg raising is normal  Strength & tone: normal   Stability overall normal stability  All other systems reviewed and are negative   The patient has been educated about the nature of the problem(s) and counseled on treatment options.  The patient appeared to understand what I have discussed and is in agreement with it.  Encounter Diagnoses  Name Primary?  . Chronic midline low back pain without sciatica Yes  . Cigarette nicotine dependence without complication   . Body mass index 45.0-49.9, adult (HCC)   . Morbid obesity (HCC)     PLAN Call if any problems.  Precautions discussed.  Continue current medications.   Return to clinic 3 months   Electronically Signed Darreld Mclean, MD 3/15/20223:02 PM

## 2021-01-25 ENCOUNTER — Telehealth: Payer: Self-pay | Admitting: Orthopaedic Surgery

## 2021-01-25 MED ORDER — OXYCODONE-ACETAMINOPHEN 7.5-325 MG PO TABS: 1.0000 | ORAL_TABLET | Freq: Four times a day (QID) | ORAL | 0 refills | Status: DC | PRN

## 2021-01-25 NOTE — Telephone Encounter (Signed)
Patient called for refill: oxyCODONE-acetaminophen (PERCOCET) 7.5-325 MG tablet 110 tablet  [facility] [place]

## 2021-02-24 ENCOUNTER — Telehealth: Payer: Self-pay | Admitting: Orthopaedic Surgery

## 2021-02-24 MED ORDER — OXYCODONE-ACETAMINOPHEN 7.5-325 MG PO TABS: 1.0000 | ORAL_TABLET | Freq: Four times a day (QID) | ORAL | 0 refills | Status: DC | PRN

## 2021-02-24 NOTE — Telephone Encounter (Signed)
Patient called request refill for pain medicine   oxyCODONE-Acetaminophen (Tab) PERCOCET 7.5-325 MG     [facility]

## 2021-03-16 ENCOUNTER — Other Ambulatory Visit (INDEPENDENT_AMBULATORY_CARE_PROVIDER_SITE_OTHER): Payer: Self-pay

## 2021-03-22 ENCOUNTER — Ambulatory Visit (INDEPENDENT_AMBULATORY_CARE_PROVIDER_SITE_OTHER): Payer: Medicare Other

## 2021-03-24 ENCOUNTER — Telehealth: Payer: Self-pay | Admitting: Orthopaedic Surgery

## 2021-03-24 NOTE — Telephone Encounter (Signed)
Patient called and request refill on  oxyCODONE-acetaminophen (PERCOCET) 7.5-325 MG tablet Qty 110  [facility]

## 2021-03-31 DIAGNOSIS — L0231 Cutaneous abscess of buttock: Secondary | ICD-10-CM | POA: Diagnosis not present

## 2021-03-31 DIAGNOSIS — U071 COVID-19: Secondary | ICD-10-CM | POA: Diagnosis not present

## 2021-03-31 DIAGNOSIS — Z20828 Contact with and (suspected) exposure to other viral communicable diseases: Secondary | ICD-10-CM | POA: Diagnosis not present

## 2021-03-31 DIAGNOSIS — R252 Cramp and spasm: Secondary | ICD-10-CM | POA: Diagnosis not present

## 2021-04-19 ENCOUNTER — Ambulatory Visit: Payer: Medicare Other | Admitting: Orthopaedic Surgery

## 2021-04-22 DIAGNOSIS — Z20822 Contact with and (suspected) exposure to covid-19: Secondary | ICD-10-CM | POA: Diagnosis not present

## 2021-04-25 ENCOUNTER — Telehealth: Payer: Self-pay | Admitting: Orthopaedic Surgery

## 2021-04-25 MED ORDER — OXYCODONE-ACETAMINOPHEN 7.5-325 MG PO TABS: 1.0000 | ORAL_TABLET | Freq: Four times a day (QID) | ORAL | 0 refills | Status: DC | PRN

## 2021-04-25 NOTE — Telephone Encounter (Signed)
Patient called - relays he had to reschedule his 04/19/21 appointment due to Covid-19. Requests refill / aware of next (re)scheduled appointment 628/22. oxyCODONE-acetaminophen (PERCOCET) 7.5-325 MG tablet 110 tablet 0      [facility] [place]

## 2021-05-03 ENCOUNTER — Ambulatory Visit: Payer: Medicare Other | Admitting: Orthopaedic Surgery

## 2021-05-17 ENCOUNTER — Encounter: Payer: Self-pay | Admitting: Orthopaedic Surgery

## 2021-05-17 ENCOUNTER — Other Ambulatory Visit: Payer: Self-pay

## 2021-05-17 ENCOUNTER — Ambulatory Visit (INDEPENDENT_AMBULATORY_CARE_PROVIDER_SITE_OTHER): Payer: Medicare Other | Admitting: Orthopaedic Surgery

## 2021-05-17 VITALS — BP 149/89 | HR 98 | Ht 72.0 in | Wt 303.0 lb

## 2021-05-17 DIAGNOSIS — G8929 Other chronic pain: Secondary | ICD-10-CM | POA: Diagnosis not present

## 2021-05-17 DIAGNOSIS — M545 Low back pain, unspecified: Secondary | ICD-10-CM | POA: Diagnosis not present

## 2021-05-17 DIAGNOSIS — F17210 Nicotine dependence, cigarettes, uncomplicated: Secondary | ICD-10-CM | POA: Diagnosis not present

## 2021-05-17 DIAGNOSIS — Z6841 Body Mass Index (BMI) 40.0 and over, adult: Secondary | ICD-10-CM

## 2021-05-17 NOTE — Progress Notes (Signed)
My back hurts  He has had more pain in the lower back over the last few weeks.  He has no new trauma, no weakness, no bowel or bladder problems.  He is active and taking his medicine.  He is doing his exercises.  Spine/Pelvis examination:  Inspection:  Overall, sacoiliac joint benign and hips nontender; without crepitus or defects.   Thoracic spine inspection: Alignment normal without kyphosis present   Lumbar spine inspection:  Alignment  with normal lumbar lordosis, without scoliosis apparent.   Thoracic spine palpation:  without tenderness of spinal processes   Lumbar spine palpation: without tenderness of lumbar area; without tightness of lumbar muscles    Range of Motion:   Lumbar flexion, forward flexion is normal without pain or tenderness    Lumbar extension is full without pain or tenderness   Left lateral bend is normal without pain or tenderness   Right lateral bend is normal without pain or tenderness   Straight leg raising is normal  Strength & tone: normal   Stability overall normal stability  Encounter Diagnoses  Name Primary?   Chronic midline low back pain without sciatica Yes   Cigarette nicotine dependence without complication    Body mass index 45.0-49.9, adult (HCC)    Morbid obesity (HCC)     I will renew his medicine next week.  He is to call.  Return in three months.  Call if any problem.  Precautions discussed.  Electronically Signed Darreld Mclean, MD 7/12/20221:42 PM

## 2021-05-24 ENCOUNTER — Telehealth: Payer: Self-pay | Admitting: Orthopaedic Surgery

## 2021-05-24 NOTE — Telephone Encounter (Signed)
Done

## 2021-05-24 NOTE — Telephone Encounter (Signed)
Patient requests refill: oxyCODONE-acetaminophen (PERCOCET) 7.5-325 MG tablet / quantity 110 tablets   [facility]

## 2021-05-25 ENCOUNTER — Telehealth: Payer: Self-pay

## 2021-05-25 MED ORDER — OXYCODONE-ACETAMINOPHEN 7.5-325 MG PO TABS: 1.0000 | ORAL_TABLET | Freq: Four times a day (QID) | ORAL | 0 refills | Status: DC | PRN

## 2021-05-25 NOTE — Telephone Encounter (Signed)
Oxycodone-Acetaminophen 7.5/325 mg  Qty 110 Tablets  [facility] 

## 2021-05-26 MED ORDER — OXYCODONE-ACETAMINOPHEN 7.5-325 MG PO TABS: 1.0000 | ORAL_TABLET | Freq: Four times a day (QID) | ORAL | 0 refills | Status: DC | PRN

## 2021-06-23 ENCOUNTER — Telehealth: Payer: Self-pay | Admitting: Orthopaedic Surgery

## 2021-06-23 MED ORDER — OXYCODONE-ACETAMINOPHEN 7.5-325 MG PO TABS: 1.0000 | ORAL_TABLET | Freq: Four times a day (QID) | ORAL | 0 refills | Status: DC | PRN

## 2021-06-23 NOTE — Telephone Encounter (Signed)
Patient requests refill: oxyCODONE-acetaminophen (PERCOCET) 7.5-325 MG tablet / quantity 110 tablets   [facility] [place] 

## 2021-07-21 ENCOUNTER — Telehealth: Payer: Self-pay | Admitting: Orthopaedic Surgery

## 2021-07-21 MED ORDER — OXYCODONE-ACETAMINOPHEN 7.5-325 MG PO TABS: 1.0000 | ORAL_TABLET | Freq: Four times a day (QID) | ORAL | 0 refills | Status: DC | PRN

## 2021-07-21 NOTE — Telephone Encounter (Signed)
Patient requests refill: oxyCODONE-acetaminophen (PERCOCET) 7.5-325 MG tablet / quantity 110 tablets   [facility] [place] 

## 2021-07-25 DIAGNOSIS — F17210 Nicotine dependence, cigarettes, uncomplicated: Secondary | ICD-10-CM | POA: Diagnosis not present

## 2021-07-25 DIAGNOSIS — Z20828 Contact with and (suspected) exposure to other viral communicable diseases: Secondary | ICD-10-CM | POA: Diagnosis not present

## 2021-08-17 ENCOUNTER — Ambulatory Visit: Payer: Medicare Other | Admitting: Orthopaedic Surgery

## 2021-08-18 ENCOUNTER — Telehealth: Payer: Self-pay | Admitting: Orthopaedic Surgery

## 2021-08-18 NOTE — Telephone Encounter (Signed)
Patient called requesting refill for his pain medicine.     oxyCODONE-acetaminophen (PERCOCET) 7.5-325 MG tablet  Pharmacy [facility]

## 2021-08-23 ENCOUNTER — Encounter: Payer: Self-pay | Admitting: Orthopaedic Surgery

## 2021-08-23 ENCOUNTER — Ambulatory Visit (INDEPENDENT_AMBULATORY_CARE_PROVIDER_SITE_OTHER): Payer: Medicare Other | Admitting: Orthopaedic Surgery

## 2021-08-23 ENCOUNTER — Other Ambulatory Visit: Payer: Self-pay

## 2021-08-23 VITALS — BP 180/113 | HR 89 | Ht 72.0 in | Wt 306.5 lb

## 2021-08-23 DIAGNOSIS — M545 Low back pain, unspecified: Secondary | ICD-10-CM

## 2021-08-23 DIAGNOSIS — G8929 Other chronic pain: Secondary | ICD-10-CM | POA: Diagnosis not present

## 2021-08-23 DIAGNOSIS — Z6841 Body Mass Index (BMI) 40.0 and over, adult: Secondary | ICD-10-CM

## 2021-08-23 DIAGNOSIS — F17210 Nicotine dependence, cigarettes, uncomplicated: Secondary | ICD-10-CM

## 2021-08-23 MED ORDER — OXYCODONE-ACETAMINOPHEN 7.5-325 MG PO TABS: 1.0000 | ORAL_TABLET | Freq: Four times a day (QID) | ORAL | 0 refills | Status: DC | PRN

## 2021-08-23 NOTE — Progress Notes (Signed)
My back is still hurting.  He has had more pain in the lower back.  He has had some extra stress.  His 45 year old son died unexpectedly about 10 days ago or so.  It was a shock.  He missed his appointment in [place] last week.  An autopsy is pending.  His lower back is also worse with the change in weather.  He has no weakness however.  He is trying to be active.  He is out of his medicine.  Spine/Pelvis examination:  Inspection:  Overall, sacoiliac joint benign and hips nontender; without crepitus or defects.   Thoracic spine inspection: Alignment normal without kyphosis present   Lumbar spine inspection:  Alignment  with normal lumbar lordosis, without scoliosis apparent.   Thoracic spine palpation:  without tenderness of spinal processes   Lumbar spine palpation: without tenderness of lumbar area; without tightness of lumbar muscles    Range of Motion:   Lumbar flexion, forward flexion is normal without pain or tenderness    Lumbar extension is full without pain or tenderness   Left lateral bend is normal without pain or tenderness   Right lateral bend is normal without pain or tenderness   Straight leg raising is normal  Strength & tone: normal   Stability overall normal stability  Encounter Diagnoses  Name Primary?   Chronic midline low back pain without sciatica Yes   Cigarette nicotine dependence without complication    Body mass index 45.0-49.9, adult (HCC)    Morbid obesity (HCC)    I will refill his pain medicine.  I have reviewed the [place] Controlled Substance Reporting System web site prior to prescribing narcotic medicine for this patient.  Return in three months.  Call if any problem.  Precautions discussed.   Electronically Signed Darreld Mclean, MD 10/18/202210:14 AM

## 2021-08-23 NOTE — Patient Instructions (Signed)

## 2021-09-21 ENCOUNTER — Telehealth: Payer: Self-pay

## 2021-09-21 NOTE — Telephone Encounter (Signed)
Oxycodone-Acetaminophen 7.5/325 mg  Qty 110 Tablets  [facility]

## 2021-09-22 MED ORDER — OXYCODONE-ACETAMINOPHEN 7.5-325 MG PO TABS: 1.0000 | ORAL_TABLET | Freq: Four times a day (QID) | ORAL | 0 refills | Status: DC | PRN

## 2021-10-16 DIAGNOSIS — F17210 Nicotine dependence, cigarettes, uncomplicated: Secondary | ICD-10-CM | POA: Diagnosis not present

## 2021-10-16 DIAGNOSIS — R220 Localized swelling, mass and lump, head: Secondary | ICD-10-CM | POA: Diagnosis not present

## 2021-10-16 DIAGNOSIS — Z23 Encounter for immunization: Secondary | ICD-10-CM | POA: Diagnosis not present

## 2021-10-16 DIAGNOSIS — H00011 Hordeolum externum right upper eyelid: Secondary | ICD-10-CM | POA: Diagnosis not present

## 2021-10-16 DIAGNOSIS — J45909 Unspecified asthma, uncomplicated: Secondary | ICD-10-CM | POA: Diagnosis not present

## 2021-10-16 DIAGNOSIS — H05011 Cellulitis of right orbit: Secondary | ICD-10-CM | POA: Diagnosis not present

## 2021-10-16 DIAGNOSIS — L03211 Cellulitis of face: Secondary | ICD-10-CM | POA: Diagnosis not present

## 2021-10-16 DIAGNOSIS — R059 Cough, unspecified: Secondary | ICD-10-CM | POA: Diagnosis not present

## 2021-10-16 DIAGNOSIS — Z720 Tobacco use: Secondary | ICD-10-CM | POA: Diagnosis not present

## 2021-10-16 DIAGNOSIS — L03213 Periorbital cellulitis: Secondary | ICD-10-CM | POA: Diagnosis not present

## 2021-10-20 ENCOUNTER — Telehealth: Payer: Self-pay | Admitting: Orthopaedic Surgery

## 2021-10-20 NOTE — Telephone Encounter (Signed)
Patient requests refill: (call received 10:55am - relayed to patient Dr Hilda Lias has already completed clinic and has left for today/next clinic day is 10/25/21) oxyCODONE-acetaminophen (PERCOCET) 7.5-325 MG tablet 110 tablet       [facility] [place]

## 2021-10-24 MED ORDER — OXYCODONE-ACETAMINOPHEN 7.5-325 MG PO TABS: 1.0000 | ORAL_TABLET | Freq: Four times a day (QID) | ORAL | 0 refills | Status: DC | PRN

## 2021-10-24 NOTE — Addendum Note (Signed)
Addended by: Earnstine Regal on: 10/24/2021 01:19 PM   Modules accepted: Orders

## 2021-11-22 ENCOUNTER — Ambulatory Visit (INDEPENDENT_AMBULATORY_CARE_PROVIDER_SITE_OTHER): Payer: Medicare Other | Admitting: Orthopaedic Surgery

## 2021-11-22 ENCOUNTER — Other Ambulatory Visit: Payer: Self-pay

## 2021-11-22 ENCOUNTER — Encounter: Payer: Self-pay | Admitting: Orthopaedic Surgery

## 2021-11-22 VITALS — BP 134/82 | HR 99 | Ht 72.0 in | Wt 305.0 lb

## 2021-11-22 DIAGNOSIS — Z6841 Body Mass Index (BMI) 40.0 and over, adult: Secondary | ICD-10-CM

## 2021-11-22 DIAGNOSIS — G8929 Other chronic pain: Secondary | ICD-10-CM | POA: Diagnosis not present

## 2021-11-22 DIAGNOSIS — M545 Low back pain, unspecified: Secondary | ICD-10-CM

## 2021-11-22 DIAGNOSIS — F17210 Nicotine dependence, cigarettes, uncomplicated: Secondary | ICD-10-CM | POA: Diagnosis not present

## 2021-11-22 MED ORDER — CYCLOBENZAPRINE HCL 10 MG PO TABS: 10.0000 mg | ORAL_TABLET | Freq: Every day | ORAL | 0 refills | Status: DC

## 2021-11-22 MED ORDER — OXYCODONE-ACETAMINOPHEN 7.5-325 MG PO TABS: 1.0000 | ORAL_TABLET | Freq: Four times a day (QID) | ORAL | 0 refills | Status: DC | PRN

## 2021-11-22 NOTE — Progress Notes (Signed)
My back still hurts at time.  He has lower back pain that is a little worse with the cold rainy weather we are having.  He has no weakness.  He is active.  He is doing his exercises  He is taking his medicine.    Spine/Pelvis examination:  Inspection:  Overall, sacoiliac joint benign and hips nontender; without crepitus or defects.   Thoracic spine inspection: Alignment normal without kyphosis present   Lumbar spine inspection:  Alignment  with normal lumbar lordosis, without scoliosis apparent.   Thoracic spine palpation:  without tenderness of spinal processes   Lumbar spine palpation: without tenderness of lumbar area; without tightness of lumbar muscles    Range of Motion:   Lumbar flexion, forward flexion is normal without pain or tenderness    Lumbar extension is full without pain or tenderness   Left lateral bend is normal without pain or tenderness   Right lateral bend is normal without pain or tenderness   Straight leg raising is normal  Strength & tone: normal   Stability overall normal stability  Encounter Diagnoses  Name Primary?   Chronic midline low back pain without sciatica Yes   Cigarette nicotine dependence without complication    Body mass index 45.0-49.9, adult (HCC)    Morbid obesity (HCC)    He has cut back significantly on his smoking.  I will refill his pain medicine and Flexeril  Continue his exercises.  Return in three months.  Call if any problem.  Precautions discussed.  I have reviewed the [place] Controlled Substance Reporting System web site prior to prescribing narcotic medicine for this patient.  Electronically Signed Darreld Mclean, MD 1/17/20231:41 PM

## 2021-12-10 DIAGNOSIS — Z041 Encounter for examination and observation following transport accident: Secondary | ICD-10-CM | POA: Diagnosis not present

## 2021-12-10 DIAGNOSIS — M25562 Pain in left knee: Secondary | ICD-10-CM | POA: Diagnosis not present

## 2021-12-10 DIAGNOSIS — R519 Headache, unspecified: Secondary | ICD-10-CM | POA: Diagnosis not present

## 2021-12-20 ENCOUNTER — Telehealth: Payer: Self-pay | Admitting: Orthopaedic Surgery

## 2021-12-20 MED ORDER — OXYCODONE-ACETAMINOPHEN 7.5-325 MG PO TABS: 1.0000 | ORAL_TABLET | Freq: Four times a day (QID) | ORAL | 0 refills | Status: DC | PRN

## 2021-12-20 NOTE — Telephone Encounter (Signed)
Patient called for refill: oxyCODONE-acetaminophen (PERCOCET) 7.5-325 MG tablet 110 tablet  -[facility] [place]  

## 2022-01-11 DIAGNOSIS — Z720 Tobacco use: Secondary | ICD-10-CM | POA: Diagnosis not present

## 2022-01-11 DIAGNOSIS — Z20822 Contact with and (suspected) exposure to covid-19: Secondary | ICD-10-CM | POA: Diagnosis not present

## 2022-01-11 DIAGNOSIS — B359 Dermatophytosis, unspecified: Secondary | ICD-10-CM | POA: Diagnosis not present

## 2022-01-11 DIAGNOSIS — R059 Cough, unspecified: Secondary | ICD-10-CM | POA: Diagnosis not present

## 2022-01-11 DIAGNOSIS — R1110 Vomiting, unspecified: Secondary | ICD-10-CM | POA: Diagnosis not present

## 2022-01-11 DIAGNOSIS — F17210 Nicotine dependence, cigarettes, uncomplicated: Secondary | ICD-10-CM | POA: Diagnosis not present

## 2022-01-11 DIAGNOSIS — J069 Acute upper respiratory infection, unspecified: Secondary | ICD-10-CM | POA: Diagnosis not present

## 2022-01-12 ENCOUNTER — Telehealth: Payer: Self-pay | Admitting: Orthopaedic Surgery

## 2022-01-12 NOTE — Telephone Encounter (Signed)
Patient called requesting refill for his pain medicine.  ? ? ?oxyCODONE-acetaminophen (PERCOCET) 7.5-325 MG tablet ? ?[facility] [place]  ? ?

## 2022-01-16 MED ORDER — OXYCODONE-ACETAMINOPHEN 7.5-325 MG PO TABS: 1.0000 | ORAL_TABLET | Freq: Four times a day (QID) | ORAL | 0 refills | Status: DC | PRN

## 2022-01-17 NOTE — Telephone Encounter (Signed)
Pt is calling regarding medication--advised to call pharmacy ?

## 2022-02-16 ENCOUNTER — Telehealth: Payer: Self-pay

## 2022-02-16 MED ORDER — OXYCODONE-ACETAMINOPHEN 7.5-325 MG PO TABS: 1.0000 | ORAL_TABLET | Freq: Four times a day (QID) | ORAL | 0 refills | Status: DC | PRN

## 2022-02-16 NOTE — Telephone Encounter (Signed)
Oxycodone-Acetaminophen  7.5/325 MG  Qty 110 Tablets ? ? ?[facility] ?

## 2022-02-17 ENCOUNTER — Telehealth: Payer: Self-pay | Admitting: Orthopaedic Surgery

## 2022-02-17 NOTE — Telephone Encounter (Signed)
Patient called states the pharmacy we sent his medicine to it is on permanent back order  ? ? ?He has called around and [facility] in [place] has the  ? ?oxyCODONE-acetaminophen (PERCOCET) 7.5-325 MG tablet   ? ?I told him it will be next Tuesday before it can be corrected and filled, that is when Dr. Hilda Lias will be back in the office.  He said this is bull shit!   ? ?He also needs a refill on  ? ?cyclobenzaprine (FLEXERIL) 10 MG tablet ? ?Please fill both RX at [facility] in [place]  ? ? ? ?

## 2022-02-20 MED ORDER — CYCLOBENZAPRINE HCL 10 MG PO TABS: 10.0000 mg | ORAL_TABLET | Freq: Every day | ORAL | 0 refills | Status: DC

## 2022-02-20 MED ORDER — OXYCODONE-ACETAMINOPHEN 7.5-325 MG PO TABS: 1.0000 | ORAL_TABLET | Freq: Four times a day (QID) | ORAL | 0 refills | Status: DC | PRN

## 2022-02-21 ENCOUNTER — Ambulatory Visit: Payer: Medicare Other

## 2022-02-21 ENCOUNTER — Encounter: Payer: Self-pay | Admitting: Orthopaedic Surgery

## 2022-02-21 ENCOUNTER — Ambulatory Visit (INDEPENDENT_AMBULATORY_CARE_PROVIDER_SITE_OTHER): Payer: Medicare Other | Admitting: Orthopaedic Surgery

## 2022-02-21 VITALS — BP 167/104 | HR 103 | Ht 72.0 in | Wt 312.0 lb

## 2022-02-21 DIAGNOSIS — M5441 Lumbago with sciatica, right side: Secondary | ICD-10-CM

## 2022-02-21 DIAGNOSIS — G8929 Other chronic pain: Secondary | ICD-10-CM | POA: Diagnosis not present

## 2022-02-21 DIAGNOSIS — M5442 Lumbago with sciatica, left side: Secondary | ICD-10-CM

## 2022-02-21 NOTE — Addendum Note (Signed)
Addended by: Elizabeth Sauer on: 02/21/2022 03:58 PM ? ? Modules accepted: Orders ? ?
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
## 2022-02-21 NOTE — Patient Instructions (Signed)
Smoking Tobacco Information, Adult Smoking tobacco can be harmful to your health. Tobacco contains a toxic colorless chemical called nicotine. Nicotine causes changes in your brain that make you want more and more. This is called addiction. This can make it hard to stop smoking once you start. Tobacco also has other toxic chemicals that can hurt your body and raise your risk of many cancers. Menthol or "lite" tobacco or cigarette brands are not safer than regular brands. How can smoking tobacco affect me? Smoking tobacco puts you at risk for: Cancer. Smoking is most commonly associated with lung cancer, but can also lead to cancer in other parts of the body. Chronic obstructive pulmonary disease (COPD). This is a long-term lung condition that makes it hard to breathe. It also gets worse over time. High blood pressure (hypertension), heart disease, stroke, heart attack, and lung infections, such as pneumonia. Cataracts. This is when the lenses in the eyes become clouded. Digestive problems. This may include peptic ulcers, heartburn, and gastroesophageal reflux disease (GERD). Oral health problems, such as gum disease, mouth sores, and tooth loss. Loss of taste and smell. Smoking also affects how you look and smell. Smoking may cause: Wrinkles. Yellow or stained teeth, fingers, and fingernails. Bad breath. Bad-smelling clothes and hair. Smoking tobacco can also affect your social life, because: It may be challenging to find places to smoke when away from home. Many workplaces, restaurants, hotels, and public places are tobacco-free. Smoking is expensive. This is due to the cost of tobacco and the long-term costs of treating health problems from smoking. Secondhand smoke may affect those around you. Secondhand smoke can cause lung cancer, breathing problems, and heart disease. Children of smokers have a higher risk for: Sudden infant death syndrome (SIDS). Ear infections. Lung infections. What  actions can I take to prevent health problems? Quit smoking  Do not start smoking. Quit if you already smoke. Do not replace cigarette smoking with vaping devices, such as e-cigarettes. Make a plan to quit smoking and commit to it. Look for programs to help you, and ask your health care provider for recommendations and ideas. Set a date and write down all the reasons you want to quit. Let your friends and family know you are quitting so they can help and support you. Consider finding friends who also want to quit. It can be easier to quit with someone else, so that you can support each other. Talk with your health care provider about using nicotine replacement medicines to help you quit. These include gum, lozenges, patches, sprays, or pills. If you try to quit but return to smoking, stay positive. It is common to slip up when you first quit, so take it one day at a time. Be prepared for cravings. When you feel the urge to smoke, chew gum or suck on hard candy. Lifestyle Stay busy. Take care of your body. Get plenty of exercise, eat a healthy diet, and drink plenty of water. Find ways to manage your stress, such as meditation, yoga, exercise, or time spent with friends and family. Ask your health care provider about having regular tests (screenings) to check for cancer. This may include blood tests, imaging tests, and other tests. Where to find support To get support to quit smoking, consider: Asking your health care provider for more information and resources. Joining a support group for people who want to quit smoking in your local community. There are many effective programs that may help you to quit. Calling the smokefree.gov counselor   helpline at 1-800-QUIT-NOW (1-800-784-8669). Where to find more information You may find more information about quitting smoking from: Centers for Disease Control and Prevention: cdc.gov/tobacco Smokefree.gov: smokefree.gov American Lung Association:  freedomfromsmoking.org Contact a health care provider if: You have problems breathing. Your lips, nose, or fingers turn blue. You have chest pain. You are coughing up blood. You feel like you will faint. You have other health changes that cause you to worry. Summary Smoking tobacco can negatively affect your health, the health of those around you, your finances, and your social life. Do not start smoking. Quit if you already smoke. If you need help quitting, ask your health care provider. Consider joining a support group for people in your local community who want to quit smoking. There are many effective programs that may help you to quit. This information is not intended to replace advice given to you by your health care provider. Make sure you discuss any questions you have with your health care provider. Document Revised: 10/18/2021 Document Reviewed: 10/18/2021 Elsevier Patient Education  2023 Elsevier Inc.  

## 2022-03-07 ENCOUNTER — Ambulatory Visit: Payer: Medicare Other | Admitting: Orthopaedic Surgery

## 2022-03-10 ENCOUNTER — Ambulatory Visit (HOSPITAL_COMMUNITY)
Admission: RE | Admit: 2022-03-10 | Discharge: 2022-03-10 | Disposition: A | Discharge status: Home / Self Care | Payer: Medicare Other | Source: Ambulatory Visit | Attending: Orthopaedic Surgery | Admitting: Orthopaedic Surgery

## 2022-03-10 DIAGNOSIS — M5442 Lumbago with sciatica, left side: Secondary | ICD-10-CM | POA: Insufficient documentation

## 2022-03-10 DIAGNOSIS — M48061 Spinal stenosis, lumbar region without neurogenic claudication: Secondary | ICD-10-CM | POA: Diagnosis not present

## 2022-03-10 DIAGNOSIS — G8929 Other chronic pain: Secondary | ICD-10-CM | POA: Insufficient documentation

## 2022-03-10 DIAGNOSIS — M5441 Lumbago with sciatica, right side: Secondary | ICD-10-CM | POA: Insufficient documentation

## 2022-03-10 DIAGNOSIS — M5116 Intervertebral disc disorders with radiculopathy, lumbar region: Secondary | ICD-10-CM | POA: Diagnosis not present

## 2022-03-14 ENCOUNTER — Ambulatory Visit (INDEPENDENT_AMBULATORY_CARE_PROVIDER_SITE_OTHER): Payer: Medicare Other | Admitting: Orthopaedic Surgery

## 2022-03-14 ENCOUNTER — Encounter: Payer: Self-pay | Admitting: Orthopaedic Surgery

## 2022-03-14 VITALS — Ht 72.0 in | Wt 315.0 lb

## 2022-03-14 DIAGNOSIS — Z6841 Body Mass Index (BMI) 40.0 and over, adult: Secondary | ICD-10-CM | POA: Diagnosis not present

## 2022-03-14 DIAGNOSIS — F17210 Nicotine dependence, cigarettes, uncomplicated: Secondary | ICD-10-CM

## 2022-03-14 DIAGNOSIS — M5441 Lumbago with sciatica, right side: Secondary | ICD-10-CM

## 2022-03-14 DIAGNOSIS — M5442 Lumbago with sciatica, left side: Secondary | ICD-10-CM | POA: Diagnosis not present

## 2022-03-14 DIAGNOSIS — G8929 Other chronic pain: Secondary | ICD-10-CM | POA: Diagnosis not present

## 2022-03-14 MED ORDER — OXYCODONE-ACETAMINOPHEN 7.5-325 MG PO TABS: 1.0000 | ORAL_TABLET | Freq: Four times a day (QID) | ORAL | 0 refills | Status: DC | PRN

## 2022-03-14 NOTE — Patient Instructions (Signed)
Smoking Tobacco Information, Adult Smoking tobacco can be harmful to your health. Tobacco contains a toxic colorless chemical called nicotine. Nicotine causes changes in your brain that make you want more and more. This is called addiction. This can make it hard to stop smoking once you start. Tobacco also has other toxic chemicals that can hurt your body and raise your risk of many cancers. Menthol or "lite" tobacco or cigarette brands are not safer than regular brands. How can smoking tobacco affect me? Smoking tobacco puts you at risk for: Cancer. Smoking is most commonly associated with lung cancer, but can also lead to cancer in other parts of the body. Chronic obstructive pulmonary disease (COPD). This is a long-term lung condition that makes it hard to breathe. It also gets worse over time. High blood pressure (hypertension), heart disease, stroke, heart attack, and lung infections, such as pneumonia. Cataracts. This is when the lenses in the eyes become clouded. Digestive problems. This may include peptic ulcers, heartburn, and gastroesophageal reflux disease (GERD). Oral health problems, such as gum disease, mouth sores, and tooth loss. Loss of taste and smell. Smoking also affects how you look and smell. Smoking may cause: Wrinkles. Yellow or stained teeth, fingers, and fingernails. Bad breath. Bad-smelling clothes and hair. Smoking tobacco can also affect your social life, because: It may be challenging to find places to smoke when away from home. Many workplaces, restaurants, hotels, and public places are tobacco-free. Smoking is expensive. This is due to the cost of tobacco and the long-term costs of treating health problems from smoking. Secondhand smoke may affect those around you. Secondhand smoke can cause lung cancer, breathing problems, and heart disease. Children of smokers have a higher risk for: Sudden infant death syndrome (SIDS). Ear infections. Lung infections. What  actions can I take to prevent health problems? Quit smoking  Do not start smoking. Quit if you already smoke. Do not replace cigarette smoking with vaping devices, such as e-cigarettes. Make a plan to quit smoking and commit to it. Look for programs to help you, and ask your health care provider for recommendations and ideas. Set a date and write down all the reasons you want to quit. Let your friends and family know you are quitting so they can help and support you. Consider finding friends who also want to quit. It can be easier to quit with someone else, so that you can support each other. Talk with your health care provider about using nicotine replacement medicines to help you quit. These include gum, lozenges, patches, sprays, or pills. If you try to quit but return to smoking, stay positive. It is common to slip up when you first quit, so take it one day at a time. Be prepared for cravings. When you feel the urge to smoke, chew gum or suck on hard candy. Lifestyle Stay busy. Take care of your body. Get plenty of exercise, eat a healthy diet, and drink plenty of water. Find ways to manage your stress, such as meditation, yoga, exercise, or time spent with friends and family. Ask your health care provider about having regular tests (screenings) to check for cancer. This may include blood tests, imaging tests, and other tests. Where to find support To get support to quit smoking, consider: Asking your health care provider for more information and resources. Joining a support group for people who want to quit smoking in your local community. There are many effective programs that may help you to quit. Calling the smokefree.gov counselor   helpline at 1-800-QUIT-NOW (1-800-784-8669). Where to find more information You may find more information about quitting smoking from: Centers for Disease Control and Prevention: cdc.gov/tobacco Smokefree.gov: smokefree.gov American Lung Association:  freedomfromsmoking.org Contact a health care provider if: You have problems breathing. Your lips, nose, or fingers turn blue. You have chest pain. You are coughing up blood. You feel like you will faint. You have other health changes that cause you to worry. Summary Smoking tobacco can negatively affect your health, the health of those around you, your finances, and your social life. Do not start smoking. Quit if you already smoke. If you need help quitting, ask your health care provider. Consider joining a support group for people in your local community who want to quit smoking. There are many effective programs that may help you to quit. This information is not intended to replace advice given to you by your health care provider. Make sure you discuss any questions you have with your health care provider. Document Revised: 10/18/2021 Document Reviewed: 10/18/2021 Elsevier Patient Education  2023 Elsevier Inc.  

## 2022-03-14 NOTE — Progress Notes (Signed)
My back is sore ? ?He had the MRI of the lumbar spine showing: ?IMPRESSION: ?1. At L4-5 there is a broad-based disc bulge flattening the ventral ?thecal sac with a small central disc protrusion. Moderate bilateral ?facet arthropathy. Moderate-severe spinal stenosis. Bilateral ?subarticular recess stenosis. Mild bilateral foraminal stenosis. ?2.  No acute osseous injury of the lumbar spine. ?  ? I have independently reviewed the MRI.   ? ?I have explained the findings to him.  I have told him about a possible epidural but he does not want to do that yet. ? ?Spine/Pelvis examination: ? Inspection:  Overall, sacoiliac joint benign and hips nontender; without crepitus or defects. ? ? Thoracic spine inspection: Alignment normal without kyphosis present ? ? Lumbar spine inspection:  Alignment  with normal lumbar lordosis, without scoliosis apparent. ? ? Thoracic spine palpation:  without tenderness of spinal processes ? ? Lumbar spine palpation: without tenderness of lumbar area; without tightness of lumbar muscles  ? ? Range of Motion: ?  Lumbar flexion, forward flexion is normal without pain or tenderness  ?  Lumbar extension is full without pain or tenderness ?  Left lateral bend is normal without pain or tenderness ?  Right lateral bend is normal without pain or tenderness ?  Straight leg raising is normal ? Strength & tone: normal ? ? Stability overall normal stability ? ?Encounter Diagnoses  ?Name Primary?  ? Chronic midline low back pain with bilateral sciatica Yes  ? Cigarette nicotine dependence without complication   ? Body mass index 45.0-49.9, adult (HCC)   ? Morbid obesity (HCC)   ? ?I will refill pain medicine, a week early.  I will be out of town next week and I hope the pharmacy will hold it until due next week. ? ?I have reviewed the [place] Controlled Substance Reporting System web site prior to prescribing narcotic medicine for this patient. ? ?Return in three months. ? ?Call if any  problem. ? ?Precautions discussed. ? ?Electronically Signed ?Darreld Mclean, MD ?5/9/20233:00 PM ? ?

## 2022-03-18 DIAGNOSIS — H9202 Otalgia, left ear: Secondary | ICD-10-CM | POA: Diagnosis not present

## 2022-03-20 ENCOUNTER — Telehealth: Payer: Self-pay | Admitting: Orthopaedic Surgery

## 2022-03-20 ENCOUNTER — Other Ambulatory Visit: Payer: Self-pay

## 2022-03-20 MED ORDER — OXYCODONE-ACETAMINOPHEN 7.5-325 MG PO TABS: 1.0000 | ORAL_TABLET | Freq: Four times a day (QID) | ORAL | 0 refills | Status: AC | PRN

## 2022-03-20 NOTE — Telephone Encounter (Signed)
Request sent to Dr. Romeo Apple due to original prescriber being out of office.  ?

## 2022-03-20 NOTE — Telephone Encounter (Signed)
[facility], per Judson Roch, called to relay that patient's medication, prescribed by Dr Luna Glasgow on 03/14/22, is on back order till at least the end of June,. ? - states they are reversing the prescription. Patient is aware and has called as well ?  ?oxyCODONE-acetaminophen (PERCOCET) 7.5-325 MG tablet 120 tablet  ? ?  - Patient called directly back to [facility] [place] [facility]. Please advise if Dr Aline Brochure can order due to Dr Luna Glasgow out of clinic until 04/11/22. ?

## 2022-04-20 ENCOUNTER — Telehealth: Payer: Self-pay | Admitting: Orthopaedic Surgery

## 2022-04-20 NOTE — Telephone Encounter (Signed)
Patient called for refill:  OxyCodone-acetaminophen(Percocet) 75-325 / quantity 28 [facility] [place]

## 2022-04-20 NOTE — Telephone Encounter (Signed)
I spoke with patient - he is insisting that we ask Dr Hilda Lias to 'send it' to pharmacy, states [facility] told him they can hold it till Sunday 04/23/22. Please advise

## 2022-04-20 NOTE — Telephone Encounter (Signed)
Spoke with Dr Hilda Lias at end of his morning clinic in regard to patient's most recent message, and per Dr Hilda Lias, he checked the website as to when medication was last filled, and it was filled on 03/26/22; therefore he may call back on Tuesday, 04/25/22, to request the refill.

## 2022-04-25 ENCOUNTER — Telehealth: Payer: Self-pay | Admitting: Radiology

## 2022-04-25 ENCOUNTER — Telehealth: Payer: Self-pay | Admitting: Orthopaedic Surgery

## 2022-04-25 MED ORDER — OXYCODONE-ACETAMINOPHEN 5-325 MG PO TABS: 1.0000 | ORAL_TABLET | Freq: Four times a day (QID) | ORAL | 0 refills | Status: AC | PRN

## 2022-04-25 NOTE — Telephone Encounter (Signed)
Patient called for refill: oxyCODONE-acetaminophen (PERCOCET) 7.5-325 MG tablet [facility] [place]

## 2022-04-25 NOTE — Telephone Encounter (Signed)
Patient called asked for refill Percocet 7.5/325, does not know which pharmacy has them.  I have asked him to call and find a pharmacy that does, then call us back to advise where to send refill to. He understands and will call back.

## 2022-04-27 ENCOUNTER — Other Ambulatory Visit: Payer: Self-pay | Admitting: Orthopaedic Surgery

## 2022-05-29 ENCOUNTER — Telehealth: Payer: Self-pay

## 2022-05-29 MED ORDER — OXYCODONE-ACETAMINOPHEN 7.5-325 MG PO TABS: 1.0000 | ORAL_TABLET | Freq: Four times a day (QID) | ORAL | 0 refills | Status: DC | PRN

## 2022-05-29 NOTE — Telephone Encounter (Signed)
Patient is asking to be switched back to 7.5/325 and 120 Tablets  Oxycodone-Acetaminophen    PATIENT USES [facility]

## 2022-06-13 ENCOUNTER — Encounter: Payer: Self-pay | Admitting: Orthopaedic Surgery

## 2022-06-13 ENCOUNTER — Ambulatory Visit (INDEPENDENT_AMBULATORY_CARE_PROVIDER_SITE_OTHER): Payer: Medicare Other | Admitting: Orthopaedic Surgery

## 2022-06-13 VITALS — BP 99/72 | HR 88 | Ht 72.0 in | Wt 310.0 lb

## 2022-06-13 DIAGNOSIS — F17210 Nicotine dependence, cigarettes, uncomplicated: Secondary | ICD-10-CM | POA: Diagnosis not present

## 2022-06-13 DIAGNOSIS — M5441 Lumbago with sciatica, right side: Secondary | ICD-10-CM

## 2022-06-13 DIAGNOSIS — Z6841 Body Mass Index (BMI) 40.0 and over, adult: Secondary | ICD-10-CM | POA: Diagnosis not present

## 2022-06-13 DIAGNOSIS — M5442 Lumbago with sciatica, left side: Secondary | ICD-10-CM | POA: Diagnosis not present

## 2022-06-13 DIAGNOSIS — G8929 Other chronic pain: Secondary | ICD-10-CM | POA: Diagnosis not present

## 2022-06-13 NOTE — Progress Notes (Signed)
My back is sore  He has had more pain with the lower back recently but has no trauma.  He has no weakness.  He is active and doing his exercises.  He is taking his medicine.  Spine/Pelvis examination:  Inspection:  Overall, sacoiliac joint benign and hips nontender; without crepitus or defects.   Thoracic spine inspection: Alignment normal without kyphosis present   Lumbar spine inspection:  Alignment  with normal lumbar lordosis, without scoliosis apparent.   Thoracic spine palpation:  without tenderness of spinal processes   Lumbar spine palpation: without tenderness of lumbar area; without tightness of lumbar muscles    Range of Motion:   Lumbar flexion, forward flexion is normal without pain or tenderness    Lumbar extension is full without pain or tenderness   Left lateral bend is normal without pain or tenderness   Right lateral bend is normal without pain or tenderness   Straight leg raising is normal  Strength & tone: normal   Stability overall normal stability  Encounter Diagnoses  Name Primary?   Chronic midline low back pain with bilateral sciatica Yes   Cigarette nicotine dependence without complication    Body mass index 45.0-49.9, adult (HCC)    Morbid obesity (HCC)    Continue present medicine and exercises.  Return in three months.  Call if any problem.  Precautions discussed.  Electronically Signed Darreld Mclean, MD 8/8/20232:12 PM

## 2022-06-22 ENCOUNTER — Telehealth: Payer: Self-pay | Admitting: Orthopaedic Surgery

## 2022-06-22 MED ORDER — OXYCODONE-ACETAMINOPHEN 7.5-325 MG PO TABS: 1.0000 | ORAL_TABLET | Freq: Four times a day (QID) | ORAL | 0 refills | Status: AC | PRN

## 2022-06-22 NOTE — Telephone Encounter (Signed)
Patient called - states per Dr Sanjuan Dame advice, today, 06/22/22, for refill: oxyCODONE-acetaminophen (PERCOCET) 7.5-325 MG tablet 120 tablet       [facility] [place]

## 2022-06-24 DIAGNOSIS — R21 Rash and other nonspecific skin eruption: Secondary | ICD-10-CM | POA: Diagnosis not present

## 2022-06-24 DIAGNOSIS — H5713 Ocular pain, bilateral: Secondary | ICD-10-CM | POA: Diagnosis not present

## 2022-07-12 DIAGNOSIS — R519 Headache, unspecified: Secondary | ICD-10-CM | POA: Diagnosis not present

## 2022-07-12 DIAGNOSIS — R0602 Shortness of breath: Secondary | ICD-10-CM | POA: Diagnosis not present

## 2022-07-12 DIAGNOSIS — Z20822 Contact with and (suspected) exposure to covid-19: Secondary | ICD-10-CM | POA: Diagnosis not present

## 2022-07-22 DIAGNOSIS — Z20822 Contact with and (suspected) exposure to covid-19: Secondary | ICD-10-CM | POA: Diagnosis not present

## 2022-07-22 DIAGNOSIS — B349 Viral infection, unspecified: Secondary | ICD-10-CM | POA: Diagnosis not present

## 2022-07-22 DIAGNOSIS — R062 Wheezing: Secondary | ICD-10-CM | POA: Diagnosis not present

## 2022-07-25 DIAGNOSIS — J45909 Unspecified asthma, uncomplicated: Secondary | ICD-10-CM | POA: Diagnosis not present

## 2022-07-25 DIAGNOSIS — R030 Elevated blood-pressure reading, without diagnosis of hypertension: Secondary | ICD-10-CM | POA: Diagnosis not present

## 2022-07-25 DIAGNOSIS — J209 Acute bronchitis, unspecified: Secondary | ICD-10-CM | POA: Diagnosis not present

## 2022-07-25 DIAGNOSIS — Z20828 Contact with and (suspected) exposure to other viral communicable diseases: Secondary | ICD-10-CM | POA: Diagnosis not present

## 2022-07-25 DIAGNOSIS — Z720 Tobacco use: Secondary | ICD-10-CM | POA: Diagnosis not present

## 2022-07-25 DIAGNOSIS — Z6841 Body Mass Index (BMI) 40.0 and over, adult: Secondary | ICD-10-CM | POA: Diagnosis not present

## 2022-07-27 ENCOUNTER — Telehealth: Payer: Self-pay | Admitting: Orthopaedic Surgery

## 2022-07-27 MED ORDER — OXYCODONE-ACETAMINOPHEN 7.5-325 MG PO TABS: 1.0000 | ORAL_TABLET | Freq: Four times a day (QID) | ORAL | 0 refills | Status: AC | PRN

## 2022-07-27 NOTE — Telephone Encounter (Signed)
Patient requests refill: oxyCODONE-acetaminophen (PERCOCET) 7.5-325 MG tablet 120 tablet  [facility] [place] [place]

## 2022-08-29 ENCOUNTER — Telehealth: Payer: Self-pay | Admitting: Orthopaedic Surgery

## 2022-08-29 MED ORDER — HYDROCODONE-ACETAMINOPHEN 7.5-325 MG PO TABS
1.0000 | ORAL_TABLET | Freq: Four times a day (QID) | ORAL | 0 refills | Status: DC | PRN
Start: 2022-08-29 — End: 2022-08-30

## 2022-08-29 NOTE — Telephone Encounter (Signed)
Patient called to relay that his pharmacy advised that Dr called in a different medication; therefore, he has not picked it up. Please advise.

## 2022-08-29 NOTE — Telephone Encounter (Signed)
Patient called for refill: oxyCODONE-acetaminophen (PERCOCET) 7.5-325 MG tablet 120 tablet      [facility] [place]

## 2022-08-30 MED ORDER — OXYCODONE-ACETAMINOPHEN 7.5-325 MG PO TABS: 1.0000 | ORAL_TABLET | Freq: Four times a day (QID) | ORAL | 0 refills | Status: DC | PRN

## 2022-08-30 NOTE — Telephone Encounter (Signed)
Spoke with pharmacy. They will delete previous Vicodin prescription. They are currently filling the Percocet 7.5 for patient.

## 2022-09-12 ENCOUNTER — Ambulatory Visit (INDEPENDENT_AMBULATORY_CARE_PROVIDER_SITE_OTHER): Payer: Medicare Other | Admitting: Orthopaedic Surgery

## 2022-09-12 ENCOUNTER — Encounter: Payer: Self-pay | Admitting: Orthopaedic Surgery

## 2022-09-12 VITALS — BP 133/95 | HR 96 | Ht 72.0 in | Wt 310.0 lb

## 2022-09-12 DIAGNOSIS — F17210 Nicotine dependence, cigarettes, uncomplicated: Secondary | ICD-10-CM

## 2022-09-12 DIAGNOSIS — G8929 Other chronic pain: Secondary | ICD-10-CM

## 2022-09-12 DIAGNOSIS — Z6841 Body Mass Index (BMI) 40.0 and over, adult: Secondary | ICD-10-CM

## 2022-09-12 DIAGNOSIS — M5442 Lumbago with sciatica, left side: Secondary | ICD-10-CM | POA: Diagnosis not present

## 2022-09-12 DIAGNOSIS — M5441 Lumbago with sciatica, right side: Secondary | ICD-10-CM | POA: Diagnosis not present

## 2022-09-12 NOTE — Progress Notes (Signed)
My back is the same.  He has chronic lower back pain and has good and bad days depending on the weather and his activity.  He has no new trauma.  He has no weakness.  He is active and taking his medicine and doing his exercises.  Spine/Pelvis examination:  Inspection:  Overall, sacoiliac joint benign and hips nontender; without crepitus or defects.   Thoracic spine inspection: Alignment normal without kyphosis present   Lumbar spine inspection:  Alignment  with normal lumbar lordosis, without scoliosis apparent.   Thoracic spine palpation:  without tenderness of spinal processes   Lumbar spine palpation: without tenderness of lumbar area; without tightness of lumbar muscles    Range of Motion:   Lumbar flexion, forward flexion is normal without pain or tenderness    Lumbar extension is full without pain or tenderness   Left lateral bend is normal without pain or tenderness   Right lateral bend is normal without pain or tenderness   Straight leg raising is normal  Strength & tone: normal   Stability overall normal stability  Encounter Diagnoses  Name Primary?   Chronic midline low back pain with bilateral sciatica Yes   Cigarette nicotine dependence without complication    Body mass index 45.0-49.9, adult (HCC)    Morbid obesity ([place])    Return in three months.  Call if any problem.  Precautions discussed.  Electronically Signed Sanjuana Kava, MD 11/7/20232:21 PM

## 2022-09-26 ENCOUNTER — Telehealth: Payer: Self-pay | Admitting: Orthopaedic Surgery

## 2022-09-26 MED ORDER — OXYCODONE-ACETAMINOPHEN 7.5-325 MG PO TABS: 1.0000 | ORAL_TABLET | Freq: Four times a day (QID) | ORAL | 0 refills | Status: DC | PRN

## 2022-09-26 NOTE — Telephone Encounter (Signed)
Patient came in office to let us know he needs a refill for his pain medicine and the pharmacy said they're about out of his medicine and he needs in called in ASAP or they won't have anymore.   oxyCODONE-acetaminophen (PERCOCET) 7.5-325 MG tablet   [facility]

## 2022-09-26 NOTE — Telephone Encounter (Signed)
SENT TO PROVIDER  

## 2022-10-04 DIAGNOSIS — Z6841 Body Mass Index (BMI) 40.0 and over, adult: Secondary | ICD-10-CM | POA: Diagnosis not present

## 2022-10-04 DIAGNOSIS — Z20828 Contact with and (suspected) exposure to other viral communicable diseases: Secondary | ICD-10-CM | POA: Diagnosis not present

## 2022-10-04 DIAGNOSIS — J45909 Unspecified asthma, uncomplicated: Secondary | ICD-10-CM | POA: Diagnosis not present

## 2022-10-04 DIAGNOSIS — Z720 Tobacco use: Secondary | ICD-10-CM | POA: Diagnosis not present

## 2022-10-04 DIAGNOSIS — I10 Essential (primary) hypertension: Secondary | ICD-10-CM | POA: Diagnosis not present

## 2022-10-04 DIAGNOSIS — J209 Acute bronchitis, unspecified: Secondary | ICD-10-CM | POA: Diagnosis not present

## 2022-10-23 DIAGNOSIS — J45909 Unspecified asthma, uncomplicated: Secondary | ICD-10-CM | POA: Diagnosis not present

## 2022-10-23 DIAGNOSIS — G8929 Other chronic pain: Secondary | ICD-10-CM | POA: Diagnosis not present

## 2022-10-23 DIAGNOSIS — Z0001 Encounter for general adult medical examination with abnormal findings: Secondary | ICD-10-CM | POA: Diagnosis not present

## 2022-10-23 DIAGNOSIS — R030 Elevated blood-pressure reading, without diagnosis of hypertension: Secondary | ICD-10-CM | POA: Diagnosis not present

## 2022-10-23 DIAGNOSIS — Z6841 Body Mass Index (BMI) 40.0 and over, adult: Secondary | ICD-10-CM | POA: Diagnosis not present

## 2022-10-23 DIAGNOSIS — Z8249 Family history of ischemic heart disease and other diseases of the circulatory system: Secondary | ICD-10-CM | POA: Diagnosis not present

## 2022-10-23 DIAGNOSIS — M545 Low back pain, unspecified: Secondary | ICD-10-CM | POA: Diagnosis not present

## 2022-10-23 DIAGNOSIS — Z8241 Family history of sudden cardiac death: Secondary | ICD-10-CM | POA: Diagnosis not present

## 2022-10-23 DIAGNOSIS — Z720 Tobacco use: Secondary | ICD-10-CM | POA: Diagnosis not present

## 2022-10-26 ENCOUNTER — Telehealth: Payer: Self-pay | Admitting: Orthopaedic Surgery

## 2022-10-26 MED ORDER — OXYCODONE-ACETAMINOPHEN 7.5-325 MG PO TABS
1.0000 | ORAL_TABLET | Freq: Four times a day (QID) | ORAL | 0 refills | Status: DC | PRN
Start: 2022-10-26 — End: 2022-10-31

## 2022-10-26 NOTE — Telephone Encounter (Signed)
Patient called, is requesting a refill for [facility] 7.5-[facility] [place].  Pt's # [phone]

## 2022-10-27 ENCOUNTER — Telehealth: Payer: Self-pay

## 2022-10-27 NOTE — Telephone Encounter (Signed)
[facility]. He is asking if Dr. Hilda Lias would send his refill to [facility]  Oxycodone-Acetaminophen 7.5/325 MG  Qty 120 Tablets  PATIENT IS USING [facility]

## 2022-10-31 MED ORDER — OXYCODONE-ACETAMINOPHEN 7.5-325 MG PO TABS: 1.0000 | ORAL_TABLET | Freq: Four times a day (QID) | ORAL | 0 refills | Status: AC | PRN

## 2022-11-13 ENCOUNTER — Telehealth: Payer: Self-pay

## 2022-11-13 NOTE — Telephone Encounter (Signed)
Patient left message on voicemail stating that he was checking about his appointment. I returned his call, got no answer or machine to leave message on.

## 2022-11-23 ENCOUNTER — Telehealth: Payer: Self-pay | Admitting: Orthopaedic Surgery

## 2022-11-23 MED ORDER — OXYCODONE-ACETAMINOPHEN 7.5-325 MG PO TABS: 1.0000 | ORAL_TABLET | ORAL | 0 refills | Status: DC | PRN

## 2022-11-23 NOTE — Addendum Note (Signed)
Addended by: Willette Pa on: 11/23/2022 10:46 AM   Modules accepted: Orders

## 2022-11-23 NOTE — Telephone Encounter (Signed)
Patient called requesting a refill on his Oxycodone 7.5-325 to [facility] [place].  Pt's # [phone]

## 2022-11-23 NOTE — Addendum Note (Signed)
Addended by: Obie Dredge A on: 11/23/2022 10:45 AM   Modules accepted: Orders

## 2022-11-27 ENCOUNTER — Telehealth: Payer: Self-pay | Admitting: Orthopaedic Surgery

## 2022-11-27 MED ORDER — OXYCODONE-ACETAMINOPHEN 7.5-325 MG PO TABS
ORAL_TABLET | ORAL | 0 refills | Status: DC
Start: 2022-11-27 — End: 2022-12-26

## 2022-11-27 NOTE — Telephone Encounter (Signed)
Patient called, lvm that he got his meds refilled on 11/23/22, but he only got 30 pills and that he normally gets 120.  Pt's # [phone]

## 2022-12-12 ENCOUNTER — Ambulatory Visit: Payer: Medicare Other | Admitting: Orthopaedic Surgery

## 2022-12-19 ENCOUNTER — Ambulatory Visit: Payer: Medicare Other | Admitting: Orthopaedic Surgery

## 2022-12-19 ENCOUNTER — Telehealth: Payer: Self-pay | Admitting: Orthopaedic Surgery

## 2022-12-19 NOTE — Telephone Encounter (Signed)
The patient called and lvm that he missed his appt this morning and would like to reschedule.  I tried to call the patient twice, I keep getting "Call can't be completed at this time"

## 2022-12-20 ENCOUNTER — Telehealth: Payer: Self-pay | Admitting: Orthopaedic Surgery

## 2022-12-20 NOTE — Telephone Encounter (Signed)
Tried returning the patient's call, unable to leave a message, mailbox is full.

## 2022-12-26 ENCOUNTER — Encounter: Payer: Self-pay | Admitting: Orthopaedic Surgery

## 2022-12-26 ENCOUNTER — Telehealth: Payer: Self-pay

## 2022-12-26 ENCOUNTER — Ambulatory Visit (INDEPENDENT_AMBULATORY_CARE_PROVIDER_SITE_OTHER): Payer: Medicare Other | Admitting: Orthopaedic Surgery

## 2022-12-26 VITALS — Ht 72.0 in | Wt 310.0 lb

## 2022-12-26 DIAGNOSIS — G8929 Other chronic pain: Secondary | ICD-10-CM

## 2022-12-26 DIAGNOSIS — F17210 Nicotine dependence, cigarettes, uncomplicated: Secondary | ICD-10-CM

## 2022-12-26 DIAGNOSIS — M5441 Lumbago with sciatica, right side: Secondary | ICD-10-CM | POA: Diagnosis not present

## 2022-12-26 DIAGNOSIS — M5442 Lumbago with sciatica, left side: Secondary | ICD-10-CM

## 2022-12-26 DIAGNOSIS — Z6841 Body Mass Index (BMI) 40.0 and over, adult: Secondary | ICD-10-CM | POA: Diagnosis not present

## 2022-12-26 MED ORDER — OXYCODONE-ACETAMINOPHEN 7.5-325 MG PO TABS: ORAL_TABLET | ORAL | 0 refills | Status: DC

## 2022-12-26 NOTE — Telephone Encounter (Signed)
Oxycodone-Acetaminophen 7.5/325 MG  Qty 120 Tablets  [facility]    PLEASE SEND IT TO [facility]

## 2022-12-26 NOTE — Patient Instructions (Addendum)
Steps to Quit Smoking Smoking tobacco is the leading cause of preventable death. It can affect almost every organ in the body. Smoking puts you and people around you at risk for many serious, long-lasting (chronic) diseases. Quitting smoking can be hard, but it is one of the best things that you can do for your health. It is never too late to quit. Do not give up if you cannot quit the first time. Some people need to try many times to quit. Do your best to stick to your quit plan, and talk with your doctor if you have any questions or concerns. How do I get ready to quit? Pick a date to quit. Set a date within the next 2 weeks to give you time to prepare. Write down the reasons why you are quitting. Keep this list in places where you will see it often. Tell your family, friends, and co-workers that you are quitting. Their support is important. Talk with your doctor about the choices that may help you quit. Find out if your health insurance will pay for these treatments. Know the people, places, things, and activities that make you want to smoke (triggers). Avoid them. What first steps can I take to quit smoking? Throw away all cigarettes at home, at work, and in your car. Throw away the things that you use when you smoke, such as ashtrays and lighters. Clean your car. Empty the ashtray. Clean your home, including curtains and carpets. What can I do to help me quit smoking? Talk with your doctor about taking medicines and seeing a counselor. You are more likely to succeed when you do both. If you are pregnant or breastfeeding: Talk with your doctor about counseling or other ways to quit smoking. Do not take medicine to help you quit smoking unless your doctor tells you to. Quit right away Quit smoking completely, instead of slowly cutting back on how much you smoke over a period of time. Stopping smoking right away may be more successful than slowly quitting. Go to counseling. In-person is best  if this is an option. You are more likely to quit if you go to counseling sessions regularly. Take medicine You may take medicines to help you quit. Some medicines need a prescription, and some you can buy over-the-counter. Some medicines may contain a drug called nicotine to replace the nicotine in cigarettes. Medicines may: Help you stop having the desire to smoke (cravings). Help to stop the problems that come when you stop smoking (withdrawal symptoms). Your doctor may ask you to use: Nicotine patches, gum, or lozenges. Nicotine inhalers or sprays. Non-nicotine medicine that you take by mouth. Find resources Find resources and other ways to help you quit smoking and remain smoke-free after you quit. They include: Online chats with a counselor. Phone quitlines. Printed self-help materials. Support groups or group counseling. Text messaging programs. Mobile phone apps. Use apps on your mobile phone or tablet that can help you stick to your quit plan. Examples of free services include Quit Guide from the CDC and smokefree.gov  What can I do to make it easier to quit?  Talk to your family and friends. Ask them to support and encourage you. Call a phone quitline, such as 1-800-QUIT-NOW, reach out to support groups, or work with a counselor. Ask people who smoke to not smoke around you. Avoid places that make you want to smoke, such as: Bars. Parties. Smoke-break areas at work. Spend time with people who do not smoke. Lower   the stress in your life. Stress can make you want to smoke. Try these things to lower stress: Getting regular exercise. Doing deep-breathing exercises. Doing yoga. Meditating. What benefits will I see if I quit smoking? Over time, you may have: A better sense of smell and taste. Less coughing and sore throat. A slower heart rate. Lower blood pressure. Clearer skin. Better breathing. Fewer sick days. Summary Quitting smoking can be hard, but it is one of  the best things that you can do for your health. Do not give up if you cannot quit the first time. Some people need to try many times to quit. When you decide to quit smoking, make a plan to help you succeed. Quit smoking right away, not slowly over a period of time. When you start quitting, get help and support to keep you smoke-free. This information is not intended to replace advice given to you by your health care provider. Make sure you discuss any questions you have with your health care provider. Document Revised: 10/14/2021 Document Reviewed: 10/14/2021 Elsevier Patient Education  2023 [place] is here all day on Tuesdays. He is here half a day on Wednesday mornings, and Thursday mornings. If you need anything such as a medication refill, please either call BEFORE the end of the day on [facility] or send a message through [place]. Your pharmacy can send a refill request for you. Calling by the end of the day on [facility] allows [place] time to send Dr.Keeling the request and for him to respond before he leaves on Thursdays.  My name is Abby and I work with Dr.Keeling. If you need anything before your next appointment, please do not hesitate to call the office at [phone] and ask to leave a message for me. I will respond within 24-48 business hours.

## 2022-12-26 NOTE — Progress Notes (Signed)
My back is worse.  He has had more back pain with the colder weather.  He has no new trauma.  He has no weakness.  He has left sided paresthesias at time.  He is doing his exercises and taking his medicine.  Spine/Pelvis examination:  Inspection:  Overall, sacoiliac joint benign and hips nontender; without crepitus or defects.   Thoracic spine inspection: Alignment normal without kyphosis present   Lumbar spine inspection:  Alignment  with normal lumbar lordosis, without scoliosis apparent.   Thoracic spine palpation:  without tenderness of spinal processes   Lumbar spine palpation: without tenderness of lumbar area; without tightness of lumbar muscles    Range of Motion:   Lumbar flexion, forward flexion is normal without pain or tenderness    Lumbar extension is full without pain or tenderness   Left lateral bend is normal without pain or tenderness   Right lateral bend is normal without pain or tenderness   Straight leg raising is normal  Strength & tone: normal   Stability overall normal stability  Encounter Diagnoses  Name Primary?   Chronic midline low back pain with bilateral sciatica Yes   Cigarette nicotine dependence without complication    Body mass index 45.0-49.9, adult ([place])    Morbid obesity ([place])    I have reviewed the [place] web site prior to prescribing narcotic medicine for this patient.  Return in three months.  Call if any problem.  Precautions discussed.  Electronically Signed Sanjuana Kava, MD 2/20/20241:56 PM

## 2023-01-15 DIAGNOSIS — R739 Hyperglycemia, unspecified: Secondary | ICD-10-CM | POA: Diagnosis not present

## 2023-01-15 DIAGNOSIS — Z0001 Encounter for general adult medical examination with abnormal findings: Secondary | ICD-10-CM | POA: Diagnosis not present

## 2023-01-15 DIAGNOSIS — Z1329 Encounter for screening for other suspected endocrine disorder: Secondary | ICD-10-CM | POA: Diagnosis not present

## 2023-01-15 DIAGNOSIS — R5383 Other fatigue: Secondary | ICD-10-CM | POA: Diagnosis not present

## 2023-01-15 DIAGNOSIS — I10 Essential (primary) hypertension: Secondary | ICD-10-CM | POA: Diagnosis not present

## 2023-01-15 DIAGNOSIS — E7849 Other hyperlipidemia: Secondary | ICD-10-CM | POA: Diagnosis not present

## 2023-01-23 ENCOUNTER — Telehealth: Payer: Self-pay

## 2023-01-23 ENCOUNTER — Telehealth: Payer: Self-pay | Admitting: Orthopaedic Surgery

## 2023-01-23 MED ORDER — OXYCODONE-ACETAMINOPHEN 7.5-325 MG PO TABS: ORAL_TABLET | ORAL | 0 refills | Status: DC

## 2023-01-23 NOTE — Telephone Encounter (Signed)
PATIENT CAME IN AFTER ALREADY CALLING IN REQUEST

## 2023-01-23 NOTE — Telephone Encounter (Signed)
Dr. Luna Glasgow patient - pt lvm requesting a refill on Oxycodone 7.5-325 to be sent to [facility].  Pt's # [phone]

## 2023-02-22 ENCOUNTER — Telehealth: Payer: Self-pay | Admitting: Orthopaedic Surgery

## 2023-02-22 MED ORDER — OXYCODONE-ACETAMINOPHEN 7.5-325 MG PO TABS: ORAL_TABLET | ORAL | 0 refills | Status: DC

## 2023-02-22 NOTE — Telephone Encounter (Signed)
Patient called and request refill on his pain medicine   oxyCODONE-acetaminophen (PERCOCET) 7.5-325 MG tablet    Pharmacy: [facility]

## 2023-03-04 IMAGING — MR MR LUMBAR SPINE W/O CM
5 series · 32 of 48 positions shown · non-contrast
Comparison: 02/21/2022

CLINICAL DATA: Lumbar radiculopathy for over 6 weeks. Low back
pain.

EXAM:
MRI LUMBAR SPINE WITHOUT CONTRAST
TECHNIQUE: Multiplanar, multisequence MR imaging of the lumbar spine was
performed. No intravenous contrast was administered.

[Series 5: T2 · sagittal · 4.0mm · 0.81mm/px · 6 of 16 slices shown (1 of 2)]
[im 1/16]
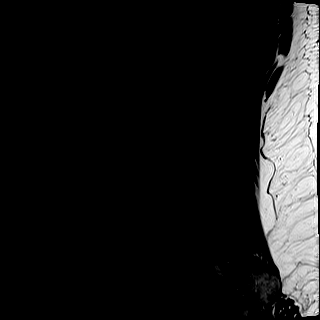
[im 4/16]
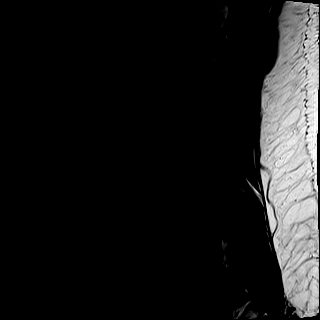
[im 7/16]
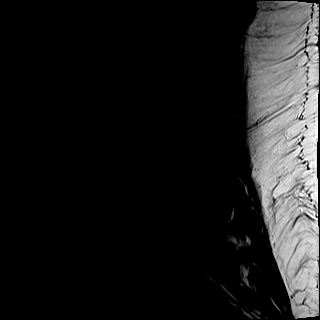
[im 10/16]
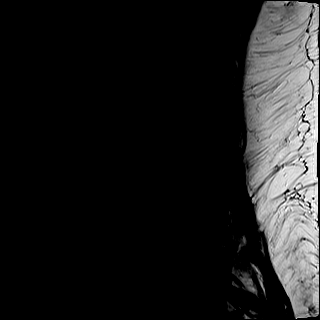
[im 13/16]
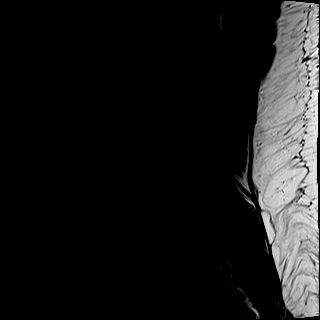
[im 16/16]
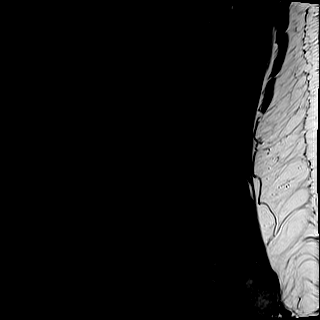

[Series 6: STIR · sagittal · 4.0mm · 0.51mm/px · 3 of 16 slices shown]
[im 1/16]
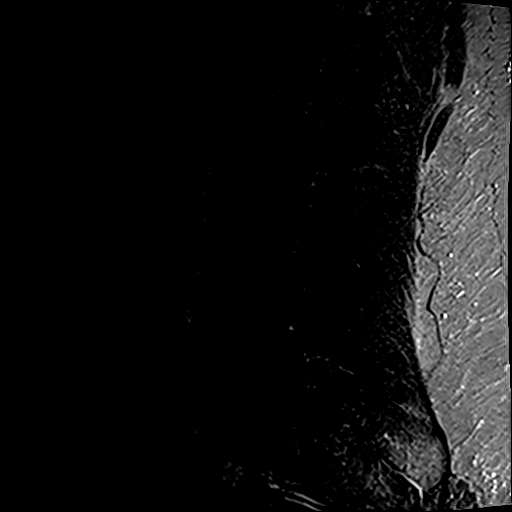
[im 3/16]
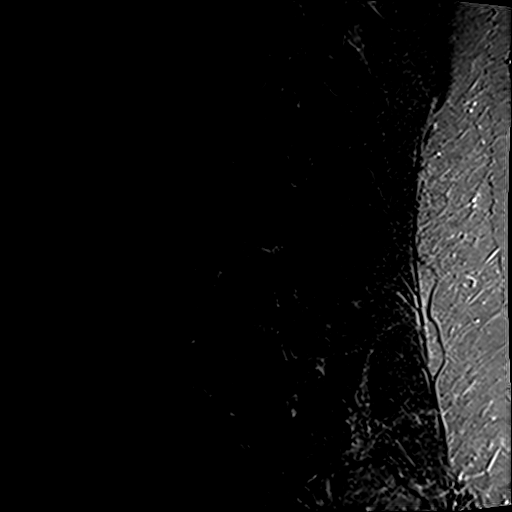
[im 6/16]
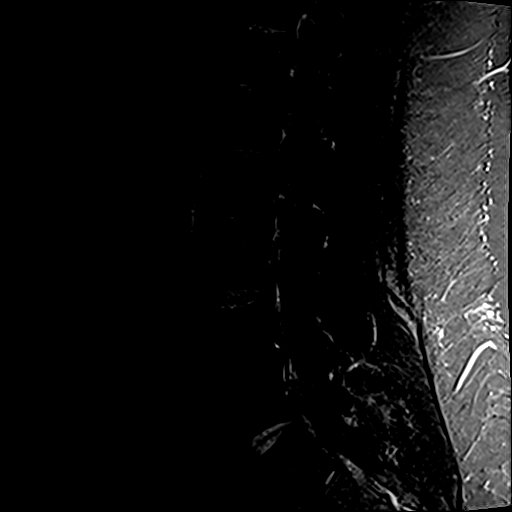

[Series 7: T1 · sagittal · 4.0mm · 1.02mm/px · 7 of 16 slices shown (1 of 2)]
[im 1/16]
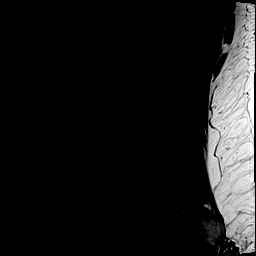
[im 3/16]
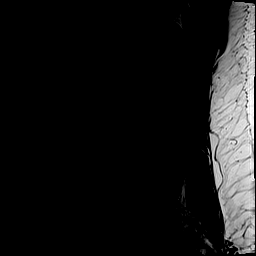
[im 6/16]
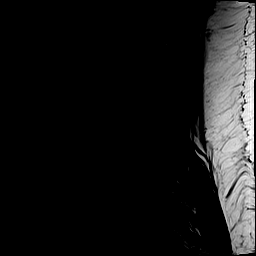
[im 8/16]
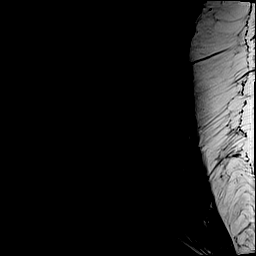
[im 11/16]
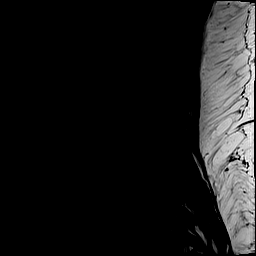
[im 13/16]
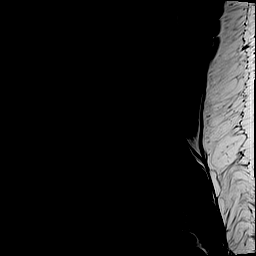
[im 16/16]
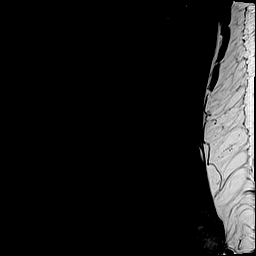

[Series 8: T2 · axial · 4.0mm · 0.70mm/px · z∈[-65,+119]mm · 8 of 33 slices shown (2 of 2)]
[im 1/33]
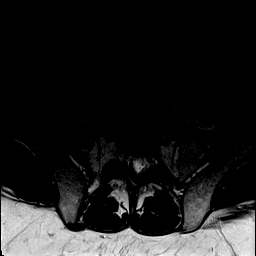
[im 5/33]
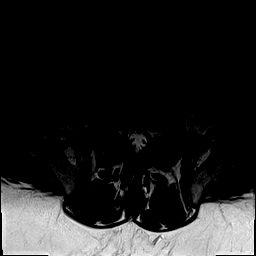
[im 10/33]
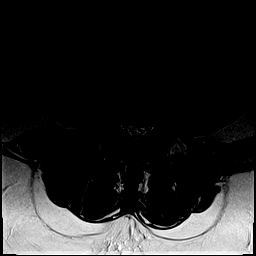
[im 15/33]
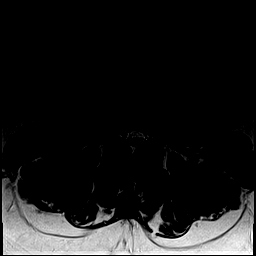
[im 18/33]
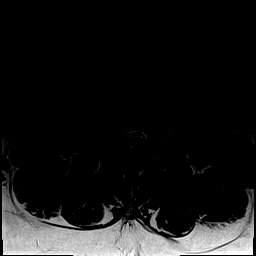
[im 23/33]
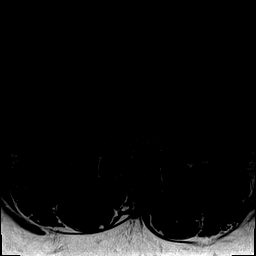
[im 28/33]
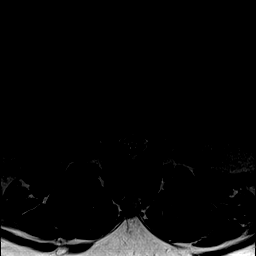
[im 33/33]
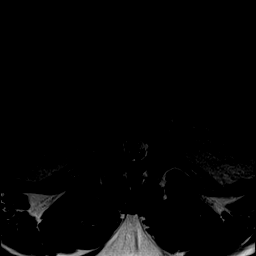

[Series 9: T1 · axial · 4.0mm · 0.35mm/px · z∈[-65,+119]mm · 8 of 33 slices shown (2 of 2)]
[im 1/33]
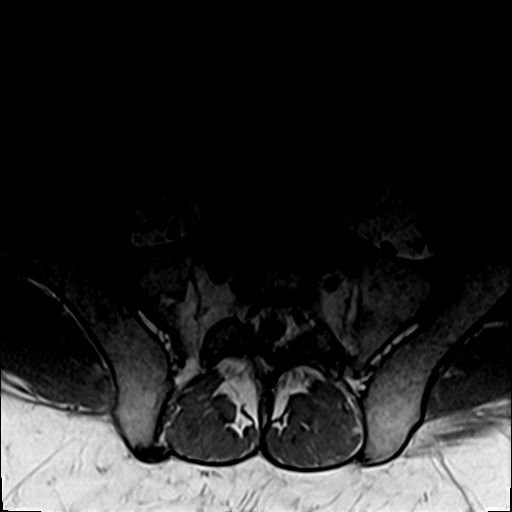
[im 5/33]
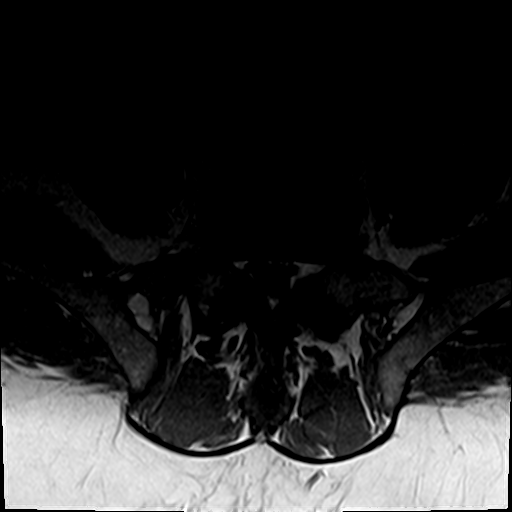
[im 10/33]
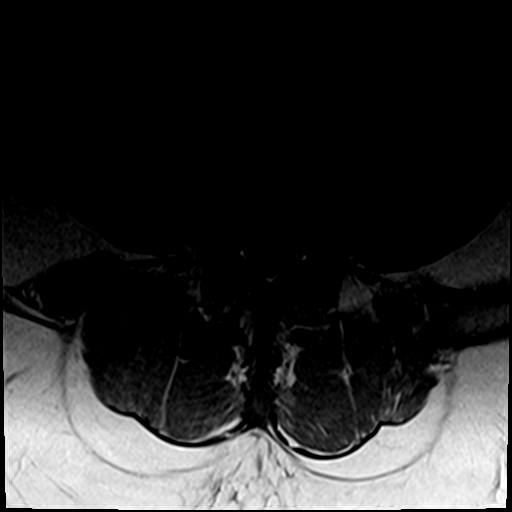
[im 15/33]
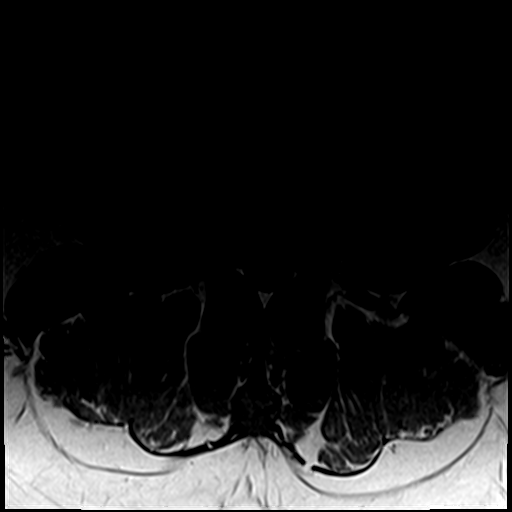
[im 18/33]
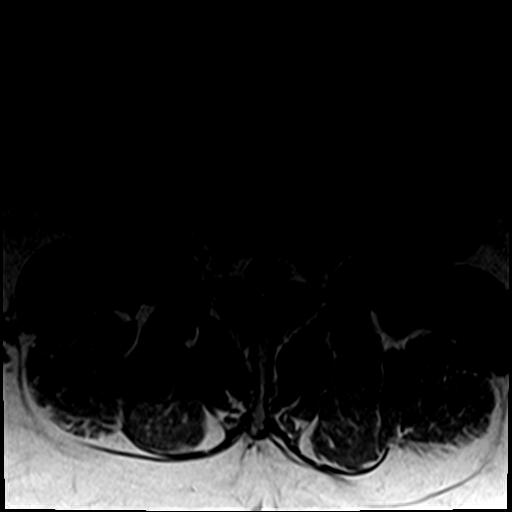
[im 23/33]
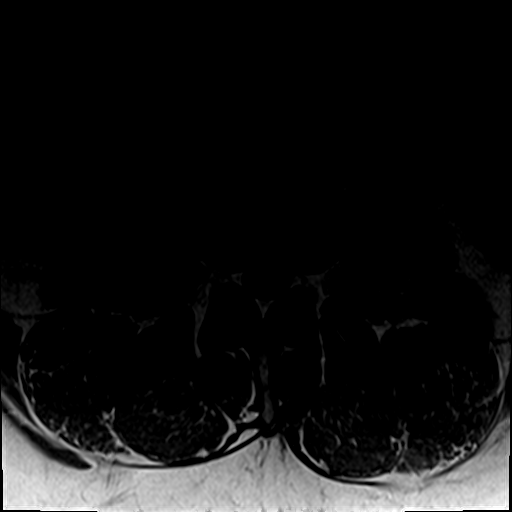
[im 28/33]
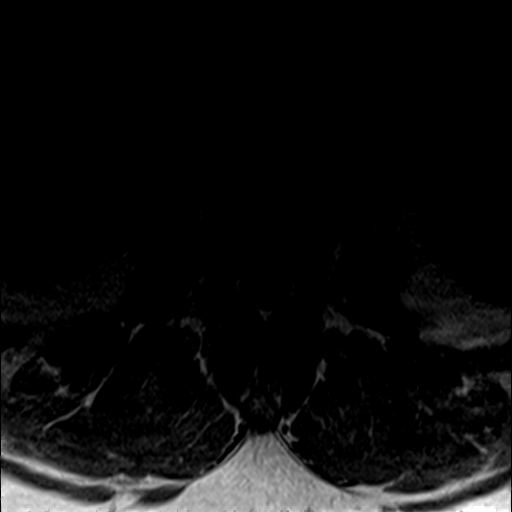
[im 33/33]
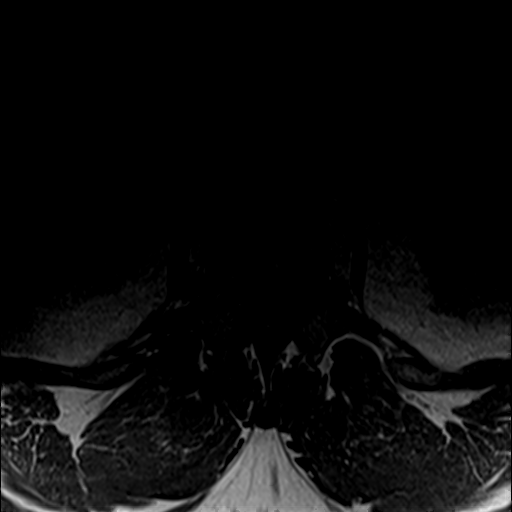

[32 of 48 positions shown; findings below may reference images not displayed]

FINDINGS: Segmentation:  Standard.

Alignment:  Physiologic.

Vertebrae: No acute fracture, evidence of discitis, or aggressive
bone lesion.

Conus medullaris and cauda equina: Conus extends to the L1 level.
Conus and cauda equina appear normal.

Paraspinal and other soft tissues: No acute paraspinal abnormality.

Disc levels:

Disc spaces: Degenerative disease with severe disc height loss at
L4-5 with reactive endplate changes.

T12-L1: No significant disc bulge. No neural foraminal stenosis. No
central canal stenosis.

L1-L2: No significant disc bulge. No neural foraminal stenosis. No
central canal stenosis. Mild bilateral facet arthropathy.

L2-L3: No significant disc bulge. No neural foraminal stenosis. No
central canal stenosis. Mild bilateral facet arthropathy.

L3-L4: No significant disc bulge. No neural foraminal stenosis. No
central canal stenosis. Mild bilateral facet arthropathy.

L4-L5: Broad-based disc bulge flattening the ventral thecal sac with
a small central disc protrusion. Moderate bilateral facet
arthropathy. Moderate-severe spinal stenosis. Bilateral subarticular
recess stenosis. Mild bilateral foraminal stenosis.

L5-S1: No significant disc bulge. No neural foraminal stenosis. No
central canal stenosis.
IMPRESSION: 1. At L4-5 there is a broad-based disc bulge flattening the ventral
thecal sac with a small central disc protrusion. Moderate bilateral
facet arthropathy. Moderate-severe spinal stenosis. Bilateral
subarticular recess stenosis. Mild bilateral foraminal stenosis.
2.  No acute osseous injury of the lumbar spine.

## 2023-03-22 ENCOUNTER — Other Ambulatory Visit: Payer: Self-pay

## 2023-03-22 NOTE — Telephone Encounter (Signed)
[place] pt----- Oxycodone-Acetaminophen 7.5/325 MG  Qty 120 Tablets   PATIENT USES [facility]

## 2023-03-22 NOTE — Telephone Encounter (Signed)
Dr. Sanjuan Dame pt - pt presented to the office stating that his [facility]cod[facility] [place], but they don't have any in stock.  He stated that [facility] [place] [facility], but they need [place] to send it to them.

## 2023-03-27 ENCOUNTER — Ambulatory Visit: Payer: Medicare Other | Admitting: Orthopaedic Surgery

## 2023-04-10 ENCOUNTER — Ambulatory Visit (INDEPENDENT_AMBULATORY_CARE_PROVIDER_SITE_OTHER): Payer: Medicare Other | Admitting: Orthopaedic Surgery

## 2023-04-10 ENCOUNTER — Encounter: Payer: Self-pay | Admitting: Orthopaedic Surgery

## 2023-04-10 DIAGNOSIS — F17210 Nicotine dependence, cigarettes, uncomplicated: Secondary | ICD-10-CM | POA: Diagnosis not present

## 2023-04-10 DIAGNOSIS — G8929 Other chronic pain: Secondary | ICD-10-CM | POA: Diagnosis not present

## 2023-04-10 DIAGNOSIS — M5442 Lumbago with sciatica, left side: Secondary | ICD-10-CM | POA: Diagnosis not present

## 2023-04-10 DIAGNOSIS — M5441 Lumbago with sciatica, right side: Secondary | ICD-10-CM | POA: Diagnosis not present

## 2023-04-10 NOTE — Progress Notes (Signed)
Virtual Visit via Telephone Note  I connected with Hayden Villa on 04/10/23 at  2:10 PM EDT by telephone and verified that I am speaking with the correct person using two identifiers.  Location: Patient: home Provider: Office   I discussed the limitations, risks, security and privacy concerns of performing an evaluation and management service by telephone and the availability of in person appointments. I also discussed with the patient that there may be a patient responsible charge related to this service. The patient expressed understanding and agreed to proceed.   History of Present Illness: He has lower back pain that is chronic.  He is taking his medicine and doing his exercises.  He has no new trauma.   Observations/Objective: Per above.  Assessment and Plan: Encounter Diagnoses  Name Primary?   Chronic midline low back pain with bilateral sciatica Yes   Cigarette nicotine dependence without complication       Follow Up Instructions: Continue medicine.  See in three months.   I discussed the assessment and treatment plan with the patient. The patient was provided an opportunity to ask questions and all were answered. The patient agreed with the plan and demonstrated an understanding of the instructions.   The patient was advised to call back or seek an in-person evaluation if the symptoms worsen or if the condition fails to improve as anticipated.  I provided 8 minutes of non-face-to-face time during this encounter.   Darreld Mclean, MD

## 2023-04-17 ENCOUNTER — Telehealth: Payer: Self-pay | Admitting: Radiology

## 2023-04-17 MED ORDER — OXYCODONE-ACETAMINOPHEN 7.5-325 MG PO TABS: ORAL_TABLET | ORAL | 0 refills | Status: DC

## 2023-04-17 NOTE — Telephone Encounter (Signed)
Refill of oxycodone requested.  [facility].

## 2023-05-15 ENCOUNTER — Telehealth: Payer: Self-pay | Admitting: Orthopaedic Surgery

## 2023-05-15 DIAGNOSIS — R0602 Shortness of breath: Secondary | ICD-10-CM | POA: Diagnosis not present

## 2023-05-15 DIAGNOSIS — Z7951 Long term (current) use of inhaled steroids: Secondary | ICD-10-CM | POA: Diagnosis not present

## 2023-05-15 DIAGNOSIS — Z720 Tobacco use: Secondary | ICD-10-CM | POA: Diagnosis not present

## 2023-05-15 DIAGNOSIS — Z20822 Contact with and (suspected) exposure to covid-19: Secondary | ICD-10-CM | POA: Diagnosis not present

## 2023-05-15 DIAGNOSIS — F17210 Nicotine dependence, cigarettes, uncomplicated: Secondary | ICD-10-CM | POA: Diagnosis not present

## 2023-05-15 DIAGNOSIS — J45909 Unspecified asthma, uncomplicated: Secondary | ICD-10-CM | POA: Diagnosis not present

## 2023-05-15 DIAGNOSIS — Z7952 Long term (current) use of systemic steroids: Secondary | ICD-10-CM | POA: Diagnosis not present

## 2023-05-15 DIAGNOSIS — J209 Acute bronchitis, unspecified: Secondary | ICD-10-CM | POA: Diagnosis not present

## 2023-05-15 MED ORDER — OXYCODONE-ACETAMINOPHEN 7.5-325 MG PO TABS: ORAL_TABLET | ORAL | 0 refills | Status: DC

## 2023-05-15 NOTE — Telephone Encounter (Signed)
Dr. Sanjuan Villa pt - spoke w/the patient, he is requesting a refill on Oxycodone 7.5-325 to be sent to [facility]

## 2023-06-11 ENCOUNTER — Telehealth: Payer: Self-pay | Admitting: Orthopaedic Surgery

## 2023-06-11 ENCOUNTER — Other Ambulatory Visit: Payer: Self-pay | Admitting: Orthopaedic Surgery

## 2023-06-11 NOTE — Telephone Encounter (Signed)
Dr. Sanjuan Dame pt - spoke w/the patient, he is requesting a refill on Oxycodone 7.5-325 be sent to [facility].

## 2023-06-22 DIAGNOSIS — Z20822 Contact with and (suspected) exposure to covid-19: Secondary | ICD-10-CM | POA: Diagnosis not present

## 2023-06-22 DIAGNOSIS — M549 Dorsalgia, unspecified: Secondary | ICD-10-CM | POA: Diagnosis not present

## 2023-06-22 DIAGNOSIS — J449 Chronic obstructive pulmonary disease, unspecified: Secondary | ICD-10-CM | POA: Diagnosis not present

## 2023-06-22 DIAGNOSIS — G8929 Other chronic pain: Secondary | ICD-10-CM | POA: Diagnosis not present

## 2023-06-22 DIAGNOSIS — R0602 Shortness of breath: Secondary | ICD-10-CM | POA: Diagnosis not present

## 2023-06-22 DIAGNOSIS — I10 Essential (primary) hypertension: Secondary | ICD-10-CM | POA: Diagnosis not present

## 2023-06-22 DIAGNOSIS — F17210 Nicotine dependence, cigarettes, uncomplicated: Secondary | ICD-10-CM | POA: Diagnosis not present

## 2023-06-22 DIAGNOSIS — J441 Chronic obstructive pulmonary disease with (acute) exacerbation: Secondary | ICD-10-CM | POA: Diagnosis not present

## 2023-07-10 ENCOUNTER — Ambulatory Visit (INDEPENDENT_AMBULATORY_CARE_PROVIDER_SITE_OTHER): Payer: Medicare Other | Admitting: Orthopaedic Surgery

## 2023-07-10 ENCOUNTER — Encounter: Payer: Self-pay | Admitting: Orthopaedic Surgery

## 2023-07-10 VITALS — BP 129/79 | HR 82 | Ht 72.0 in | Wt 298.0 lb

## 2023-07-10 DIAGNOSIS — M5441 Lumbago with sciatica, right side: Secondary | ICD-10-CM | POA: Diagnosis not present

## 2023-07-10 DIAGNOSIS — M5442 Lumbago with sciatica, left side: Secondary | ICD-10-CM | POA: Diagnosis not present

## 2023-07-10 DIAGNOSIS — G8929 Other chronic pain: Secondary | ICD-10-CM

## 2023-07-10 DIAGNOSIS — F17210 Nicotine dependence, cigarettes, uncomplicated: Secondary | ICD-10-CM | POA: Diagnosis not present

## 2023-07-10 DIAGNOSIS — Z6841 Body Mass Index (BMI) 40.0 and over, adult: Secondary | ICD-10-CM | POA: Diagnosis not present

## 2023-07-10 MED ORDER — OXYCODONE-ACETAMINOPHEN 7.5-325 MG PO TABS: ORAL_TABLET | ORAL | 0 refills | Status: DC

## 2023-07-10 NOTE — Progress Notes (Signed)
My back hurts at times.  He has good and bad days.  He is active.  He has lost 10 pounds since last here.  He does his exercises.  His back pain is dependent on activity and the weather.  He has no numbness or trauma.  Spine/Pelvis examination:  Inspection:  Overall, sacoiliac joint benign and hips nontender; without crepitus or defects.   Thoracic spine inspection: Alignment normal without kyphosis present   Lumbar spine inspection:  Alignment  with normal lumbar lordosis, without scoliosis apparent.   Thoracic spine palpation:  without tenderness of spinal processes   Lumbar spine palpation: without tenderness of lumbar area; without tightness of lumbar muscles    Range of Motion:   Lumbar flexion, forward flexion is normal without pain or tenderness    Lumbar extension is full without pain or tenderness   Left lateral bend is normal without pain or tenderness   Right lateral bend is normal without pain or tenderness   Straight leg raising is normal  Strength & tone: normal   Stability overall normal stability  Encounter Diagnoses  Name Primary?   Chronic midline low back pain with bilateral sciatica Yes   Cigarette nicotine dependence without complication    Body mass index 45.0-49.9, adult ([facility])    Morbid obesity ([facility])    I have reviewed the [place] Controlled Substance Reporting System web site prior to prescribing narcotic medicine for this patient.  Return in three months.  Continue exercises and weight reduction.  Call if any problem.  Precautions discussed.  Electronically Signed Darreld Mclean, MD 9/3/20241:33 PM

## 2023-08-07 ENCOUNTER — Telehealth: Payer: Self-pay | Admitting: Orthopaedic Surgery

## 2023-08-07 MED ORDER — OXYCODONE-ACETAMINOPHEN 7.5-325 MG PO TABS: ORAL_TABLET | ORAL | 0 refills | Status: DC

## 2023-08-07 NOTE — Telephone Encounter (Signed)
Dr. Sanjuan Dame pt - spoke w/the patient, he is requesting a refill on Oxycodone 7.5-325 to be sent to [facility]

## 2023-09-03 ENCOUNTER — Telehealth: Payer: Self-pay

## 2023-09-03 NOTE — Telephone Encounter (Signed)
Patient called wanting to put in for his pain medication. I explained to him that it was too early and it would be denied. I told him to call on Wednesday morning since his medication will be due while Dr. Hilda Lias is not here the next few days. Patient stated he understood.

## 2023-09-05 ENCOUNTER — Telehealth: Payer: Self-pay | Admitting: Orthopaedic Surgery

## 2023-09-05 MED ORDER — OXYCODONE-ACETAMINOPHEN 7.5-325 MG PO TABS: ORAL_TABLET | ORAL | 0 refills | Status: DC

## 2023-09-05 NOTE — Telephone Encounter (Signed)
Dr. Sanjuan Villa pt - spoke w/the pt, he is requesting a refill on Oxycodone 7.5-325 to be sent to [facility]

## 2023-10-02 ENCOUNTER — Telehealth: Payer: Self-pay | Admitting: Orthopaedic Surgery

## 2023-10-02 MED ORDER — OXYCODONE-ACETAMINOPHEN 7.5-325 MG PO TABS: ORAL_TABLET | ORAL | 0 refills | Status: DC

## 2023-10-02 NOTE — Telephone Encounter (Signed)
Dr. Sanjuan Dame pt - spoke w/the pt, pt is requesting a refill on Oxycodone 7.5-325 to be sent to [facility].

## 2023-10-09 ENCOUNTER — Ambulatory Visit: Payer: Medicare Other | Admitting: Orthopaedic Surgery

## 2023-10-10 ENCOUNTER — Ambulatory Visit: Payer: Medicare Other | Admitting: Orthopaedic Surgery

## 2023-10-10 ENCOUNTER — Encounter: Payer: Self-pay | Admitting: Orthopaedic Surgery

## 2023-10-10 VITALS — BP 120/67 | HR 92 | Ht 72.0 in | Wt 318.0 lb

## 2023-10-10 DIAGNOSIS — M5441 Lumbago with sciatica, right side: Secondary | ICD-10-CM

## 2023-10-10 DIAGNOSIS — F17210 Nicotine dependence, cigarettes, uncomplicated: Secondary | ICD-10-CM

## 2023-10-10 DIAGNOSIS — M5442 Lumbago with sciatica, left side: Secondary | ICD-10-CM

## 2023-10-10 DIAGNOSIS — G8929 Other chronic pain: Secondary | ICD-10-CM

## 2023-10-10 NOTE — Progress Notes (Signed)
My back is worse.  He has more back pain with the cold weather we have been having.  He has no new trauma, no weakness.  He is taking his medicine and doing his exercises.  Lower back is tender, ROM decreased secondary to pain, NV intact, muscle tone and strength is normal.  Encounter Diagnoses  Name Primary?   Chronic midline low back pain with bilateral sciatica Yes   Cigarette nicotine dependence without complication    Continue exercises.  I will renew pain medicine end of month.  Call if any problem.  Precautions discussed.  Electronically Signed Darreld Mclean, MD 12/4/20241:28 PM

## 2023-10-25 ENCOUNTER — Telehealth: Payer: Self-pay | Admitting: Orthopaedic Surgery

## 2023-10-25 MED ORDER — OXYCODONE-ACETAMINOPHEN 7.5-325 MG PO TABS: ORAL_TABLET | ORAL | 0 refills | Status: DC

## 2023-10-25 NOTE — Telephone Encounter (Signed)
DR .Hilda Lias  Patient called at 10:23 am left message he had talked with Dr. Hilda Lias and wanted to know when he needs to call in for a refill.  It is due now so he wants a refill on his pain medicine    oxyCODONE-acetaminophen (PERCOCET) 7.5-325 MG tablet   PHARMACY  [facility]

## 2023-11-01 DIAGNOSIS — R9431 Abnormal electrocardiogram [ECG] [EKG]: Secondary | ICD-10-CM | POA: Diagnosis not present

## 2023-11-01 DIAGNOSIS — F17210 Nicotine dependence, cigarettes, uncomplicated: Secondary | ICD-10-CM | POA: Diagnosis not present

## 2023-11-01 DIAGNOSIS — R000 Tachycardia, unspecified: Secondary | ICD-10-CM | POA: Diagnosis not present

## 2023-11-01 DIAGNOSIS — R0602 Shortness of breath: Secondary | ICD-10-CM | POA: Diagnosis not present

## 2023-11-01 DIAGNOSIS — J45901 Unspecified asthma with (acute) exacerbation: Secondary | ICD-10-CM | POA: Diagnosis not present

## 2023-11-01 DIAGNOSIS — Z79899 Other long term (current) drug therapy: Secondary | ICD-10-CM | POA: Diagnosis not present

## 2023-11-03 DIAGNOSIS — R0600 Dyspnea, unspecified: Secondary | ICD-10-CM | POA: Diagnosis not present

## 2023-11-28 DIAGNOSIS — R0981 Nasal congestion: Secondary | ICD-10-CM | POA: Diagnosis not present

## 2023-11-28 DIAGNOSIS — G8929 Other chronic pain: Secondary | ICD-10-CM | POA: Diagnosis not present

## 2023-11-28 DIAGNOSIS — Z7951 Long term (current) use of inhaled steroids: Secondary | ICD-10-CM | POA: Diagnosis not present

## 2023-11-28 DIAGNOSIS — J209 Acute bronchitis, unspecified: Secondary | ICD-10-CM | POA: Diagnosis not present

## 2023-11-28 DIAGNOSIS — K0889 Other specified disorders of teeth and supporting structures: Secondary | ICD-10-CM | POA: Diagnosis not present

## 2023-11-28 DIAGNOSIS — Z791 Long term (current) use of non-steroidal anti-inflammatories (NSAID): Secondary | ICD-10-CM | POA: Diagnosis not present

## 2023-11-28 DIAGNOSIS — M549 Dorsalgia, unspecified: Secondary | ICD-10-CM | POA: Diagnosis not present

## 2023-11-28 DIAGNOSIS — F17210 Nicotine dependence, cigarettes, uncomplicated: Secondary | ICD-10-CM | POA: Diagnosis not present

## 2023-11-28 DIAGNOSIS — J440 Chronic obstructive pulmonary disease with acute lower respiratory infection: Secondary | ICD-10-CM | POA: Diagnosis not present

## 2023-11-28 DIAGNOSIS — J449 Chronic obstructive pulmonary disease, unspecified: Secondary | ICD-10-CM | POA: Diagnosis not present

## 2023-11-28 DIAGNOSIS — I10 Essential (primary) hypertension: Secondary | ICD-10-CM | POA: Diagnosis not present

## 2023-12-19 ENCOUNTER — Telehealth: Payer: Self-pay | Admitting: Orthopaedic Surgery

## 2024-01-09 ENCOUNTER — Ambulatory Visit: Payer: Medicare Other | Admitting: Orthopaedic Surgery

## 2024-01-09 ENCOUNTER — Encounter: Payer: Self-pay | Admitting: Orthopaedic Surgery

## 2024-01-09 VITALS — BP 135/85 | HR 107

## 2024-01-09 DIAGNOSIS — M5441 Lumbago with sciatica, right side: Secondary | ICD-10-CM

## 2024-01-09 DIAGNOSIS — F17210 Nicotine dependence, cigarettes, uncomplicated: Secondary | ICD-10-CM | POA: Diagnosis not present

## 2024-01-09 DIAGNOSIS — G8929 Other chronic pain: Secondary | ICD-10-CM

## 2024-01-09 DIAGNOSIS — M5442 Lumbago with sciatica, left side: Secondary | ICD-10-CM | POA: Diagnosis not present

## 2024-01-09 DIAGNOSIS — Z6841 Body Mass Index (BMI) 40.0 and over, adult: Secondary | ICD-10-CM | POA: Diagnosis not present

## 2024-01-09 NOTE — Progress Notes (Signed)
 My back hurts.  He has chronic lower back pain that comes and goes, good days and bad days.  He is worse with increased activity and rainy days.  He has no new trauma, no untoward events.  He is working and taking his medicine.  Lower back is tender, ROM full, muscle tone and strength normal, NV intact, normal gait.  Encounter Diagnoses  Name Primary?   Chronic midline low back pain with bilateral sciatica Yes   Cigarette nicotine dependence without complication    Body mass index 45.0-49.9, adult ([facility])    Morbid obesity ([facility])    Return in three months.  Call if any problem.  Precautions discussed.  Electronically Signed Darreld Mclean, MD 3/5/20251:30 PM

## 2024-01-16 ENCOUNTER — Telehealth: Payer: Self-pay | Admitting: Orthopaedic Surgery

## 2024-01-16 NOTE — Telephone Encounter (Signed)
 Dr. Sanjuan Dame pt - spoke w/the pt, he is requesting a refill for Oxycodone 7.5-325 to be sent to [facility]

## 2024-02-13 ENCOUNTER — Telehealth: Payer: Self-pay | Admitting: Orthopaedic Surgery

## 2024-03-12 ENCOUNTER — Other Ambulatory Visit: Payer: Self-pay | Admitting: Orthopaedic Surgery

## 2024-03-12 ENCOUNTER — Telehealth: Payer: Self-pay | Admitting: Orthopaedic Surgery

## 2024-03-12 NOTE — Addendum Note (Signed)
 Addended by: [place] Snow T on: 03/12/2024 11:31 AM   Modules accepted: Orders

## 2024-03-12 NOTE — Telephone Encounter (Signed)
 Dr. Sanjuan Dame pt - spoke w/the pt, he is requesting a refill for Oxycodone 7.5-325 to be sent to [facility]

## 2024-03-26 ENCOUNTER — Emergency Department (HOSPITAL_COMMUNITY)
Admission: EM | Admit: 2024-03-26 | Discharge: 2024-03-26 | Disposition: A | Discharge status: Home / Self Care | Attending: Emergency Medicine | Admitting: Emergency Medicine

## 2024-03-26 ENCOUNTER — Encounter (HOSPITAL_COMMUNITY): Payer: Self-pay | Admitting: Emergency Medicine

## 2024-03-26 ENCOUNTER — Other Ambulatory Visit: Payer: Self-pay

## 2024-03-26 ENCOUNTER — Emergency Department (HOSPITAL_COMMUNITY)

## 2024-03-26 DIAGNOSIS — M79604 Pain in right leg: Secondary | ICD-10-CM | POA: Diagnosis not present

## 2024-03-26 DIAGNOSIS — I10 Essential (primary) hypertension: Secondary | ICD-10-CM | POA: Insufficient documentation

## 2024-03-26 DIAGNOSIS — R0989 Other specified symptoms and signs involving the circulatory and respiratory systems: Secondary | ICD-10-CM | POA: Diagnosis not present

## 2024-03-26 DIAGNOSIS — F17210 Nicotine dependence, cigarettes, uncomplicated: Secondary | ICD-10-CM | POA: Diagnosis not present

## 2024-03-26 DIAGNOSIS — M79605 Pain in left leg: Secondary | ICD-10-CM | POA: Diagnosis not present

## 2024-03-26 DIAGNOSIS — R0602 Shortness of breath: Secondary | ICD-10-CM | POA: Diagnosis not present

## 2024-03-26 DIAGNOSIS — M7989 Other specified soft tissue disorders: Secondary | ICD-10-CM | POA: Diagnosis present

## 2024-03-26 DIAGNOSIS — R600 Localized edema: Secondary | ICD-10-CM | POA: Diagnosis not present

## 2024-03-26 DIAGNOSIS — J449 Chronic obstructive pulmonary disease, unspecified: Secondary | ICD-10-CM | POA: Insufficient documentation

## 2024-03-26 DIAGNOSIS — I509 Heart failure, unspecified: Secondary | ICD-10-CM | POA: Diagnosis not present

## 2024-03-26 LAB — COMPREHENSIVE METABOLIC PANEL WITH GFR
ALT: 30 U/L (ref 0–44)
AST: 37 U/L (ref 15–41)
Albumin: 3.5 g/dL (ref 3.5–5.0)
Alkaline Phosphatase: 50 U/L (ref 38–126)
Anion gap: 6 (ref 5–15)
BUN: 12 mg/dL (ref 6–20)
CO2: 26 mmol/L (ref 22–32)
Calcium: 8.4 mg/dL — ABNORMAL LOW (ref 8.9–10.3)
Chloride: 103 mmol/L (ref 98–111)
Creatinine, Ser: 0.99 mg/dL (ref 0.61–1.24)
GFR, Estimated: >60 mL/min (ref >60)
Glucose, Bld: 101 mg/dL — ABNORMAL HIGH (ref 70–99)
Potassium: 3.5 mmol/L (ref 3.5–5.1)
Sodium: 140 mmol/L (ref 135–145)
Total Bilirubin: 0.3 mg/dL (ref 0.0–1.2)
Total Protein: 6 g/dL — ABNORMAL LOW (ref 6.5–8.1)

## 2024-03-26 LAB — CBC
HCT: 43.2 % (ref 39.0–52.0)
Hemoglobin: 14 g/dL (ref 13.0–17.0)
MCH: 29.3 pg (ref 26.0–34.0)
MCHC: 32.4 g/dL (ref 30.0–36.0)
MCV: 90.4 fL (ref 80.0–100.0)
Platelets: 219 10*3/uL (ref 150–400)
RBC: 4.78 MIL/uL (ref 4.22–5.81)
RDW: 14.2 % (ref 11.5–15.5)
WBC: 6.2 10*3/uL (ref 4.0–10.5)
nRBC: 0 % (ref 0.0–0.2)

## 2024-03-26 LAB — BRAIN NATRIURETIC PEPTIDE: B Natriuretic Peptide: 15 pg/mL (ref 0.0–100.0)

## 2024-03-26 MED ORDER — FUROSEMIDE 20 MG PO TABS: 20.0000 mg | ORAL_TABLET | Freq: Every day | ORAL | 0 refills | Status: AC | PRN

## 2024-03-26 MED ORDER — IBUPROFEN 800 MG PO TABS
800.0000 mg | ORAL_TABLET | Freq: Once | ORAL | Status: AC
  Administered 2024-03-26: 800 mg via ORAL
  Filled 2024-03-26: qty 1

## 2024-03-26 NOTE — Discharge Instructions (Addendum)
 You were evaluated in the Emergency Department and after careful evaluation, we did not find any emergent condition requiring admission or further testing in the hospital.  Your exam/testing today is overall reassuring.  Ultrasound did not show any blood clots.  Your leg swelling may be related to your heart.  Important that you follow-up with your primary care doctor as we discussed and with cardiology.  Can use the Lasix medication daily as needed for leg swelling.  Continue elevating the legs and using compression socks.  Try to avoid salt in your diet.  Please return to the Emergency Department if you experience any worsening of your condition.   Thank you for allowing us  to be a part of your care.

## 2024-03-26 NOTE — ED Notes (Signed)
 Pt ambulatory to waiting room. Pt verbalized understanding of discharge instructions.

## 2024-03-26 NOTE — ED Notes (Signed)
 [place] at bedside

## 2024-03-26 NOTE — ED Provider Notes (Signed)
  Provider Note MRN:  [id number]  Arrival date & time: 03/26/24    ED Course and Medical Decision Making  Assumed care from Dr Harless Lien at shift change.  See note from prior team for complete details, in brief:  Clinical Course as of 03/26/24 1006  Wed Mar 26, 2024  0708 Handoff MB 48 yo/m XR with mild ?chf Given lasix Pending duplex  [SG]  1003 Duplex neg for dvt [SG]    Clinical Course User Index [SG] Russella Courts A, DO    Leg swelling may be secondary to mild chf, BNP is not elev, will give lasix for home, outpatient f/u with cardiology was advised. No hypoxia. Does not require admission at this time  Patient in no distress and overall condition improved here in the ED. Detailed discussions were had with the patient/guardian regarding current findings, and need for close f/u with PCP or on call doctor. The patient/guardian has been instructed to return immediately if the symptoms worsen in any way for re-evaluation. Patient/guardian verbalized understanding and is in agreement with current care plan. All questions answered prior to discharge.     Procedures  Final Clinical Impressions(s) / ED Diagnoses     ICD-10-CM   1. Leg edema  R60.0 Ambulatory referral to Cardiology      ED Discharge Orders          Ordered    furosemide (LASIX) 20 MG tablet  Daily PRN        03/26/24 0605    Ambulatory referral to Cardiology       Comments: If you have not heard from the Cardiology office within the next 72 hours please call [phone].   03/26/24 1005              Discharge Instructions      You were evaluated in the Emergency Department and after careful evaluation, we did not find any emergent condition requiring admission or further testing in the hospital.  Your exam/testing today is overall reassuring.  Ultrasound did not show any blood clots.  Your leg swelling may be related to your heart.  Important that you follow-up with your primary care doctor as we  discussed and with cardiology.  Can use the Lasix medication daily as needed for leg swelling.  Continue elevating the legs and using compression socks.  Try to avoid salt in your diet.  Please return to the Emergency Department if you experience any worsening of your condition.   Thank you for allowing us  to be a part of your care.      Russella Courts A, DO 03/26/24 1006

## 2024-03-26 NOTE — ED Provider Notes (Signed)
 [facility]-EMERGENCY DEPT [facility] Emergency Department Provider Note MRN:  [id number]  Arrival date & time: 03/26/24     Chief Complaint   Leg Swelling   History of Present Illness   Hayden Villa is a 48 y.o. year-old male with a history of hypertension, COPD presenting to the ED with chief complaint of leg swelling.  Bilateral leg swelling over the past week or so, not going away, has never happened before.  Denies chest pain or shortness of breath, has continued occasional cough, wheezing from his continued smoking.  Review of Systems  A thorough review of systems was obtained and all systems are negative except as noted in the HPI and PMH.   Patient's Health History    Past Medical History:  Diagnosis Date   Asthma    Hypertension     Past Surgical History:  Procedure Laterality Date   NO PAST SURGERIES      Family History  Problem Relation Age of Onset   Hypertension Other     Social History   Socioeconomic History   Marital status: Single    Spouse name: Not on file   Number of children: Not on file   Years of education: Not on file   Highest education level: Not on file  Occupational History   Not on file  Tobacco Use   Smoking status: Every Day    Current packs/day: 1.50    Average packs/day: 1.5 packs/day for 25.0 years (37.5 ttl pk-yrs)    Types: Cigarettes   Smokeless tobacco: Never  Substance and Sexual Activity   Alcohol use: Yes    Comment: rarely   Drug use: Yes    Types: Marijuana    Comment: Patient denies   Sexual activity: Not on file  Other Topics Concern   Not on file  Social History Narrative   Not on file   Social Drivers of Health   Financial Resource Strain: Not on file  Food Insecurity: Not on file  Transportation Needs: Not on file  Physical Activity: Not on file  Stress: Not on file  Social Connections: Unknown (03/20/2022)   Received from [facility], [facility]   Social Network    Social Network: Not on file   Intimate Partner Violence: Unknown (02/09/2022)   Received from [facility], [facility]   HITS    Physically Hurt: Not on file    Insult or Talk Down To: Not on file    Threaten Physical Harm: Not on file    Scream or Curse: Not on file     Physical Exam   Vitals:   03/26/24 0153 03/26/24 0547  BP: (!) 163/86 (!) 128/114  Pulse: 85 77  Resp: 17 17  Temp: 98.1 F (36.7 C) 97.7 F (36.5 C)  SpO2: 96% 94%    CONSTITUTIONAL: Well-appearing, NAD NEURO/PSYCH:  Alert and oriented x 3, no focal deficits EYES:  eyes equal and reactive ENT/NECK:  no LAD, no JVD CARDIO: Regular rate, well-perfused, normal S1 and S2 PULM: Scattered wheeze, no increased work of breathing GI/GU:  non-distended, non-tender MSK/SPINE:  No gross deformities, pitting edema to bilateral lower extremities SKIN:  no rash, atraumatic   *Additional and/or pertinent findings included in MDM below  Diagnostic and Interventional Summary    EKG Interpretation Date/Time:    Ventricular Rate:    PR Interval:    QRS Duration:    QT Interval:    QTC Calculation:   R Axis:  Text Interpretation:         Labs Reviewed  COMPREHENSIVE METABOLIC PANEL WITH GFR - Abnormal; Notable for the following components:      Result Value   Glucose, Bld 101 (*)    Calcium 8.4 (*)    Total Protein 6.0 (*)    All other components within normal limits  CBC  BRAIN NATRIURETIC PEPTIDE    DG Chest Port 1 View  Final Result    US  Venous Img Lower Bilateral (DVT)    (Results Pending)    Medications  ibuprofen  (ADVIL ) tablet 800 mg (800 mg Oral Given 03/26/24 0548)     Procedures  /  Critical Care Procedures  ED Course and Medical Decision Making  Initial Impression and Ddx Symmetric lower extremity swelling, question venous insufficiency versus renal/cardiac/liver dysfunction.  Patient has concerns for DVT but I find this to be less likely given the symmetric nature and lack of significant  tenderness.  Past medical/surgical history that increases complexity of ED encounter: COPD  Interpretation of Diagnostics I personally reviewed the Laboratory Testing and my interpretation is as follows: No significant blood count or electrolyte disturbance.  Chest x-ray with some evidence of CHF  Patient Reassessment and Ultimate Disposition/Management     BNP pending, patient worried about DVT and so ultrasound pending as well.  Anticipating discharge, signed out to oncoming provider at shift change.  Patient management required discussion with the following services or consulting groups:  None  Complexity of Problems Addressed Acute illness or injury that poses threat of life of bodily function  Additional Data Reviewed and Analyzed Further history obtained from: None  Additional Factors Impacting ED Encounter Risk Prescriptions  Merrick Abe. Harless Lien, MD [facility] Emergency Medicine [facility] [email or web address]  Final Clinical Impressions(s) / ED Diagnoses     ICD-10-CM   1. Leg edema  R60.0       ED Discharge Orders     None        Discharge Instructions Discussed with and Provided to Patient:   Discharge Instructions   None      Edson Graces, MD 03/26/24 [phone]

## 2024-03-26 NOTE — ED Triage Notes (Signed)
 Pt c/o bilateral lower leg swelling for a couple of days and dental pain.

## 2024-03-28 DIAGNOSIS — F17210 Nicotine dependence, cigarettes, uncomplicated: Secondary | ICD-10-CM | POA: Diagnosis not present

## 2024-03-28 DIAGNOSIS — G4733 Obstructive sleep apnea (adult) (pediatric): Secondary | ICD-10-CM | POA: Diagnosis not present

## 2024-03-28 DIAGNOSIS — I509 Heart failure, unspecified: Secondary | ICD-10-CM | POA: Diagnosis not present

## 2024-04-02 ENCOUNTER — Telehealth: Payer: Self-pay | Admitting: Orthopaedic Surgery

## 2024-04-02 NOTE — Telephone Encounter (Signed)
 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  Did anyone call him?

## 2024-04-02 NOTE — Telephone Encounter (Signed)
 Wasn't me.

## 2024-04-04 DIAGNOSIS — R5383 Other fatigue: Secondary | ICD-10-CM | POA: Diagnosis not present

## 2024-04-04 DIAGNOSIS — I10 Essential (primary) hypertension: Secondary | ICD-10-CM | POA: Diagnosis not present

## 2024-04-04 DIAGNOSIS — Z131 Encounter for screening for diabetes mellitus: Secondary | ICD-10-CM | POA: Diagnosis not present

## 2024-04-04 DIAGNOSIS — Z8249 Family history of ischemic heart disease and other diseases of the circulatory system: Secondary | ICD-10-CM | POA: Diagnosis not present

## 2024-04-04 DIAGNOSIS — G8929 Other chronic pain: Secondary | ICD-10-CM | POA: Diagnosis not present

## 2024-04-09 ENCOUNTER — Ambulatory Visit: Admitting: Orthopaedic Surgery

## 2024-04-09 ENCOUNTER — Encounter: Payer: Self-pay | Admitting: Orthopaedic Surgery

## 2024-04-09 DIAGNOSIS — G8929 Other chronic pain: Secondary | ICD-10-CM | POA: Diagnosis not present

## 2024-04-09 DIAGNOSIS — Z6841 Body Mass Index (BMI) 40.0 and over, adult: Secondary | ICD-10-CM

## 2024-04-09 DIAGNOSIS — M5442 Lumbago with sciatica, left side: Secondary | ICD-10-CM | POA: Diagnosis not present

## 2024-04-09 DIAGNOSIS — F17210 Nicotine dependence, cigarettes, uncomplicated: Secondary | ICD-10-CM | POA: Diagnosis not present

## 2024-04-09 DIAGNOSIS — M5441 Lumbago with sciatica, right side: Secondary | ICD-10-CM

## 2024-04-09 MED ORDER — OXYCODONE-ACETAMINOPHEN 7.5-325 MG PO TABS: 1.0000 | ORAL_TABLET | Freq: Four times a day (QID) | ORAL | 0 refills | Status: DC | PRN

## 2024-04-09 NOTE — Progress Notes (Signed)
 I have my days  His pain comes and goes to the lower back.  He has no new trauma, no numbness, no weakness.  Spine/Pelvis examination:  Inspection:  Overall, sacoiliac joint benign and hips nontender; without crepitus or defects.   Thoracic spine inspection: Alignment normal without kyphosis present   Lumbar spine inspection:  Alignment  with normal lumbar lordosis, without scoliosis apparent.   Thoracic spine palpation:  without tenderness of spinal processes   Lumbar spine palpation: with tenderness of lumbar area; with tightness of lumbar muscles    Range of Motion:   Lumbar flexion, forward flexion is 25 with pain or tenderness    Lumbar extension is 10 without pain or tenderness   Left lateral bend is Normal  without pain or tenderness   Right lateral bend is Normal without pain or tenderness   Straight leg raising is Normal   Strength & tone: Normal   Stability overall normal stability    Encounter Diagnoses  Name Primary?   Chronic midline low back pain with bilateral sciatica Yes   Cigarette nicotine  dependence without complication    Body mass index 45.0-49.9, adult (HCC)    Morbid obesity (HCC)    I have reviewed the [place]  Controlled Substance Reporting System web site prior to prescribing narcotic medicine for this patient.  Return in three months.  Continue his exercises.  Call if any problem.  Precautions discussed.  Electronically Signed Pleasant Brilliant, MD 6/4/202511:01 AM

## 2024-04-11 DIAGNOSIS — I10 Essential (primary) hypertension: Secondary | ICD-10-CM | POA: Diagnosis not present

## 2024-04-11 DIAGNOSIS — Z1389 Encounter for screening for other disorder: Secondary | ICD-10-CM | POA: Diagnosis not present

## 2024-04-11 DIAGNOSIS — Z0001 Encounter for general adult medical examination with abnormal findings: Secondary | ICD-10-CM | POA: Diagnosis not present

## 2024-04-11 DIAGNOSIS — I509 Heart failure, unspecified: Secondary | ICD-10-CM | POA: Diagnosis not present

## 2024-04-11 DIAGNOSIS — F17210 Nicotine dependence, cigarettes, uncomplicated: Secondary | ICD-10-CM | POA: Diagnosis not present

## 2024-04-11 DIAGNOSIS — Z0000 Encounter for general adult medical examination without abnormal findings: Secondary | ICD-10-CM | POA: Diagnosis not present

## 2024-04-15 ENCOUNTER — Encounter: Payer: Self-pay | Admitting: Internal Medicine

## 2024-04-15 ENCOUNTER — Ambulatory Visit: Attending: Internal Medicine | Admitting: Internal Medicine

## 2024-04-15 VITALS — BP 138/88 | HR 83 | Ht 72.0 in | Wt 324.0 lb

## 2024-04-15 DIAGNOSIS — R0609 Other forms of dyspnea: Secondary | ICD-10-CM | POA: Diagnosis not present

## 2024-04-15 DIAGNOSIS — I10 Essential (primary) hypertension: Secondary | ICD-10-CM

## 2024-04-15 DIAGNOSIS — I509 Heart failure, unspecified: Secondary | ICD-10-CM | POA: Diagnosis not present

## 2024-04-15 DIAGNOSIS — M7989 Other specified soft tissue disorders: Secondary | ICD-10-CM | POA: Diagnosis not present

## 2024-04-15 MED ORDER — SPIRONOLACTONE 25 MG PO TABS: 25.0000 mg | ORAL_TABLET | Freq: Every day | ORAL | 5 refills | Status: DC

## 2024-04-15 NOTE — Progress Notes (Signed)
 Cardiology Office Note  Date: 04/15/2024   ID: Hayden Villa, DOB 1976-07-31, MRN [id number]  PCP:  Hayden Riggs, PA-C  Cardiologist:  Hayden Pointer, MD Electrophysiologist:  None   History of Present Illness: Hayden Villa is a 48 y.o. male  Referred to cardiology clinic for evaluation of leg swelling.  Has DOE but not bothersome and still able to exert.  No angina.  No dizziness, syncope, palpitations.  He has bilateral leg swelling ongoing for couple of years.  His DOE also has been chronic, no recent worsening.  He had recent ER visit in May 2025, BNP normal and chest x-ray showed mild pulmonary vascular congestion with no overt edema.  Is discharged on p.o. Lasix  20 mg as needed but currently taking it daily.  He reported that his PCP increased the dose of the medication, he does not remember.  I reviewed the medications with the patient, he does not take labetalol  or lisinopril.  He reported that he did not tolerate labetalol  and had to stop the medication.  He did not tolerate another medication that his PCP started him on.  His son passed away around 3 years ago suddenly.  He went to sleep while driving and passed away.  His mother had heart issues but no CAD or CHF.  Past Medical History:  Diagnosis Date   Asthma    Hypertension     Past Surgical History:  Procedure Laterality Date   NO PAST SURGERIES      Current Outpatient Medications  Medication Sig Dispense Refill   albuterol  (VENTOLIN  HFA) 108 (90 Base) MCG/ACT inhaler Inhale 2 puffs into the lungs every 6 (six) hours as needed for wheezing or shortness of breath.      cyclobenzaprine  (FLEXERIL ) 10 MG tablet TAKE 1 TABLET BY MOUTH AT BEDTIME. ONE TABLET EVERY NIGHT AT BEDTIME AS NEEDED FOR SPASM. 30 tablet 0   furosemide  (LASIX ) 20 MG tablet Take 1 tablet (20 mg total) by mouth daily as needed (leg swelling). 30 tablet 0   labetalol  (NORMODYNE ) 100 MG tablet Take 100 mg by mouth 2 (two) times daily.      oxyCODONE -acetaminophen  (PERCOCET) 7.5-325 MG tablet Take 1 tablet by mouth every 6 (six) hours as needed. for pain 120 tablet 0   No current facility-administered medications for this visit.   Allergies:  Patient has no known allergies.   Social History: The patient  reports that he has been smoking cigarettes. He has a 37.5 pack-year smoking history. He has never used smokeless tobacco. He reports current alcohol use. He reports current drug use. Drug: Marijuana.   Family History: The patient's family history includes Hypertension in an other family member.   ROS:  Please see the history of present illness. Otherwise, complete review of systems is positive for none  All other systems are reviewed and negative.   Physical Exam: VS:  There were no vitals taken for this visit., BMI There is no height or weight on file to calculate BMI.  Wt Readings from Last 3 Encounters:  03/26/24 (!) 313 lb (142 kg)  10/10/23 (!) 318 lb (144.2 kg)  07/10/23 298 lb (135.2 kg)    General: Patient appears comfortable at rest. HEENT: Conjunctiva and lids normal, oropharynx clear with moist mucosa. Neck: Supple, no elevated JVP or carotid bruits, no thyromegaly. Lungs: Clear to auscultation, nonlabored breathing at rest. Cardiac: Regular rate and rhythm, no S3 or significant systolic murmur, no pericardial rub. Abdomen: Soft, nontender,  no hepatomegaly, bowel sounds present, no guarding or rebound. Extremities: 3+ pitting edema Skin: Warm and dry. Musculoskeletal: No kyphosis. Neuropsychiatric: Alert and oriented x3, affect grossly appropriate.  Recent Labwork: 03/26/2024: ALT 30; AST 37; B Natriuretic Peptide 15.0; BUN 12; Creatinine, Ser 0.99; Hemoglobin 14.0; Platelets 219; Potassium 3.5; Sodium 140  No results found for: "CHOL", "TRIG", "HDL", "CHOLHDL", "VLDL", "LDLCALC", "LDLDIRECT"   Assessment and Plan:   Congestive heart failure: Chronic DOE and bilateral pitting edema x couple of  years.  BNP normal. Chest x-ray showed pulmonary vascular congestion in May 2025.  Currently on p.o. Lasix  unknown dose (patient instructed to call the clinic and update us  with the dose) with improvement in DOE and leg swelling.  Will continue the current Lasix  dose.  Start spironolactone 25 mg once daily.  BMP in 5 days. Obtain echocardiogram.  I reviewed his CMP which was within normal limits.  HTN, controlled: He reported he is not taking labetalol  or lisinopril.  He did not tolerate.  Encouraged to check blood pressures at home.      Medication Adjustments/Labs and Tests Ordered: Current medicines are reviewed at length with the patient today.  Concerns regarding medicines are outlined above.    Disposition:  Follow up 6 weeks  Signed Hayden Massey Priya Waleska Buttery, MD, 04/15/2024 2:21 PM    [facility] at [facility] [address] [place], [place], [place] [address]

## 2024-04-15 NOTE — Patient Instructions (Addendum)
 Medication Instructions:  Your physician has recommended you make the following change in your medication:  Start taking Spironolactone 25 mg once daily Continue taking all other medications as prescribed  Labwork: BMET in one week at [facility] in [place] (04/23/2024)  Testing/Procedures: Your physician has requested that you have an echocardiogram. Echocardiography is a painless test that uses sound waves to create images of your heart. It provides your doctor with information about the size and shape of your heart and how well your heart's chambers and valves are working. This procedure takes approximately one hour. There are no restrictions for this procedure. Please do NOT wear cologne, perfume, aftershave, or lotions (deodorant is allowed). Please arrive 15 minutes prior to your appointment time.  Please note: We ask at that you not bring children with you during ultrasound (echo/ vascular) testing. Due to room size and safety concerns, children are not allowed in the ultrasound rooms during exams. Our front office staff cannot provide observation of children in our lobby area while testing is being conducted. An adult accompanying a patient to their appointment will only be allowed in the ultrasound room at the discretion of the ultrasound technician under special circumstances. We apologize for any inconvenience.   Follow-Up: Your physician recommends that you schedule a follow-up appointment in: 6 weeks  Any Other Special Instructions Will Be Listed Below (If Applicable). Please call the office to let us  know what dosage of Lasix  you are taking   Thank you for choosing [facility]!     If you need a refill on your cardiac medications before your next appointment, please call your pharmacy.

## 2024-04-16 ENCOUNTER — Other Ambulatory Visit (HOSPITAL_COMMUNITY)
Admission: RE | Admit: 2024-04-16 | Discharge: 2024-04-16 | Disposition: A | Discharge status: Home / Self Care | Source: Ambulatory Visit | Attending: Internal Medicine | Admitting: Internal Medicine

## 2024-04-16 DIAGNOSIS — R0609 Other forms of dyspnea: Secondary | ICD-10-CM | POA: Diagnosis not present

## 2024-04-16 DIAGNOSIS — M7989 Other specified soft tissue disorders: Secondary | ICD-10-CM | POA: Diagnosis not present

## 2024-04-16 LAB — BASIC METABOLIC PANEL WITH GFR
Anion gap: 12 (ref 5–15)
BUN: 12 mg/dL (ref 6–20)
CO2: 25 mmol/L (ref 22–32)
Calcium: 8.8 mg/dL — ABNORMAL LOW (ref 8.9–10.3)
Chloride: 98 mmol/L (ref 98–111)
Creatinine, Ser: 1.19 mg/dL (ref 0.61–1.24)
GFR, Estimated: >60 mL/min (ref >60)
Glucose, Bld: 131 mg/dL — ABNORMAL HIGH (ref 70–99)
Potassium: 3.5 mmol/L (ref 3.5–5.1)
Sodium: 135 mmol/L (ref 135–145)

## 2024-04-18 ENCOUNTER — Ambulatory Visit: Payer: Self-pay | Admitting: Internal Medicine

## 2024-04-21 NOTE — Telephone Encounter (Signed)
 Spoke with patient advised him that labs were done too early. Asked if he has started Spironolactone , he said yes. Advise him will mail out another lab order to have BMET repeated. Patient verbalized understanding.

## 2024-04-21 NOTE — Telephone Encounter (Signed)
-----   Message from Vishnu P Mallipeddi sent at 04/18/2024  1:14 PM EDT ----- He will need BMP in 5 days after spironolactone  was started.  He got this BMP done in 1 day after new medication was started.  Repeat BMP. ----- Message ----- From: Interface, Lab In [place] Sent: 04/16/2024   1:20 PM EDT To: Vishnu P Mallipeddi, MD

## 2024-04-25 DIAGNOSIS — J441 Chronic obstructive pulmonary disease with (acute) exacerbation: Secondary | ICD-10-CM | POA: Diagnosis not present

## 2024-04-25 DIAGNOSIS — Z20822 Contact with and (suspected) exposure to covid-19: Secondary | ICD-10-CM | POA: Diagnosis not present

## 2024-04-25 DIAGNOSIS — Z79899 Other long term (current) drug therapy: Secondary | ICD-10-CM | POA: Diagnosis not present

## 2024-04-25 DIAGNOSIS — R9389 Abnormal findings on diagnostic imaging of other specified body structures: Secondary | ICD-10-CM | POA: Diagnosis not present

## 2024-04-25 DIAGNOSIS — G8929 Other chronic pain: Secondary | ICD-10-CM | POA: Diagnosis not present

## 2024-04-25 DIAGNOSIS — R600 Localized edema: Secondary | ICD-10-CM | POA: Diagnosis not present

## 2024-04-25 DIAGNOSIS — I5033 Acute on chronic diastolic (congestive) heart failure: Secondary | ICD-10-CM | POA: Diagnosis not present

## 2024-04-25 DIAGNOSIS — F17210 Nicotine dependence, cigarettes, uncomplicated: Secondary | ICD-10-CM | POA: Diagnosis not present

## 2024-04-25 DIAGNOSIS — I110 Hypertensive heart disease with heart failure: Secondary | ICD-10-CM | POA: Diagnosis not present

## 2024-04-25 DIAGNOSIS — M7989 Other specified soft tissue disorders: Secondary | ICD-10-CM | POA: Diagnosis not present

## 2024-04-25 DIAGNOSIS — R0602 Shortness of breath: Secondary | ICD-10-CM | POA: Diagnosis not present

## 2024-04-25 DIAGNOSIS — E781 Pure hyperglyceridemia: Secondary | ICD-10-CM | POA: Diagnosis not present

## 2024-04-25 DIAGNOSIS — I509 Heart failure, unspecified: Secondary | ICD-10-CM | POA: Diagnosis not present

## 2024-04-25 DIAGNOSIS — G4733 Obstructive sleep apnea (adult) (pediatric): Secondary | ICD-10-CM | POA: Diagnosis not present

## 2024-04-25 DIAGNOSIS — I440 Atrioventricular block, first degree: Secondary | ICD-10-CM | POA: Diagnosis not present

## 2024-04-25 DIAGNOSIS — M79604 Pain in right leg: Secondary | ICD-10-CM | POA: Diagnosis not present

## 2024-04-25 DIAGNOSIS — M79605 Pain in left leg: Secondary | ICD-10-CM | POA: Diagnosis not present

## 2024-04-26 DIAGNOSIS — J441 Chronic obstructive pulmonary disease with (acute) exacerbation: Secondary | ICD-10-CM | POA: Insufficient documentation

## 2024-04-29 DIAGNOSIS — I5033 Acute on chronic diastolic (congestive) heart failure: Secondary | ICD-10-CM | POA: Insufficient documentation

## 2024-04-29 DIAGNOSIS — G8929 Other chronic pain: Secondary | ICD-10-CM | POA: Insufficient documentation

## 2024-05-06 ENCOUNTER — Telehealth: Payer: Self-pay | Admitting: Orthopaedic Surgery

## 2024-05-06 ENCOUNTER — Ambulatory Visit

## 2024-05-20 ENCOUNTER — Ambulatory Visit: Admitting: Internal Medicine

## 2024-05-27 ENCOUNTER — Other Ambulatory Visit

## 2024-05-30 ENCOUNTER — Encounter: Payer: Self-pay | Admitting: Internal Medicine

## 2024-06-04 ENCOUNTER — Telehealth: Payer: Self-pay

## 2024-06-04 MED ORDER — OXYCODONE-ACETAMINOPHEN 7.5-325 MG PO TABS: 1.0000 | ORAL_TABLET | Freq: Four times a day (QID) | ORAL | 0 refills | Status: DC | PRN

## 2024-06-10 ENCOUNTER — Encounter: Payer: Self-pay | Admitting: Internal Medicine

## 2024-06-10 ENCOUNTER — Ambulatory Visit: Attending: Internal Medicine | Admitting: Internal Medicine

## 2024-06-10 NOTE — Progress Notes (Signed)
 Erroneous encounter - please disregard.

## 2024-06-12 ENCOUNTER — Ambulatory Visit: Admitting: Internal Medicine

## 2024-06-23 NOTE — Telephone Encounter (Signed)
 Completed.

## 2024-07-02 ENCOUNTER — Telehealth: Payer: Self-pay | Admitting: Orthopaedic Surgery

## 2024-07-02 NOTE — Telephone Encounter (Signed)
 Dr. Sanjuan Dame pt - spoke w/the pt, he is requesting a refill for Oxycodone 7.5-325 to be sent to [facility]

## 2024-07-09 ENCOUNTER — Encounter: Payer: Self-pay | Admitting: Orthopaedic Surgery

## 2024-07-09 ENCOUNTER — Ambulatory Visit (INDEPENDENT_AMBULATORY_CARE_PROVIDER_SITE_OTHER): Admitting: Orthopaedic Surgery

## 2024-07-09 DIAGNOSIS — M5442 Lumbago with sciatica, left side: Secondary | ICD-10-CM | POA: Diagnosis not present

## 2024-07-09 DIAGNOSIS — G8929 Other chronic pain: Secondary | ICD-10-CM | POA: Diagnosis not present

## 2024-07-09 DIAGNOSIS — M5441 Lumbago with sciatica, right side: Secondary | ICD-10-CM

## 2024-07-09 DIAGNOSIS — F17210 Nicotine dependence, cigarettes, uncomplicated: Secondary | ICD-10-CM

## 2024-07-09 DIAGNOSIS — Z6841 Body Mass Index (BMI) 40.0 and over, adult: Secondary | ICD-10-CM

## 2024-07-09 NOTE — Progress Notes (Signed)
 My back is about the same.  He has good and bad days with the lower back, more bad days depending on weather and what he does.  He has no new episodes.  He has no new trauma or weakness.  Spine/Pelvis examination:  Inspection:  Overall, sacoiliac joint benign and hips nontender; without crepitus or defects.   Thoracic spine inspection: Alignment normal without kyphosis present   Lumbar spine inspection:  Alignment  with normal lumbar lordosis, without scoliosis apparent.   Thoracic spine palpation:  without tenderness of spinal processes   Lumbar spine palpation: without tenderness of lumbar area; without tightness of lumbar muscles    Range of Motion:   Lumbar flexion, forward flexion is normal without pain or tenderness    Lumbar extension is full without pain or tenderness   Left lateral bend is normal without pain or tenderness   Right lateral bend is normal without pain or tenderness   Straight leg raising is normal  Strength & tone: normal   Stability overall normal stability  Encounter Diagnoses  Name Primary?   Chronic midline low back pain with bilateral sciatica Yes   Cigarette nicotine  dependence without complication    Body mass index 45.0-49.9, adult (HCC)    Morbid obesity (HCC)    I refilled his pain medicine recently.  Return in three months.  I have informed the patient I will be retiring from medical practice and from this office on August 07, 2024.  The patient has been offered continuing care with Dr. Margrette or Dr. Onesimo of this office.  The patient may choose another provider and the records will be forwarded after proper signature and notification.  Patient understands and agrees.  Call if any problem.  Precautions discussed.  Electronically Signed Lemond Stable, MD 9/3/20252:39 PM

## 2024-07-28 ENCOUNTER — Telehealth: Payer: Self-pay | Admitting: Orthopaedic Surgery

## 2024-07-28 NOTE — Telephone Encounter (Signed)
 DR. BRENNA [facility] faxed over a request to refill patient pain medicine    oxyCODONE -acetaminophen  (PERCOCET) 7.5-325 MG tablet   Pharmacy : [facility]

## 2024-07-29 MED ORDER — OXYCODONE-ACETAMINOPHEN 7.5-325 MG PO TABS: 1.0000 | ORAL_TABLET | Freq: Four times a day (QID) | ORAL | 0 refills | Status: DC | PRN

## 2024-08-26 ENCOUNTER — Telehealth: Payer: Self-pay | Admitting: Orthopedic Surgery

## 2024-08-26 NOTE — Telephone Encounter (Signed)
 Dr. Areatha pt, former Dr. MARLA pt - spoke w/the pt this morning, he is requesting a refill, but has not established w/anyone else here yet.  He is now scheduled for 09/05/24 with Dr. VEAR.  Oxycodone  7.5-325, 120 tablets, every 6hrs PRN to [facility]

## 2024-08-27 ENCOUNTER — Other Ambulatory Visit: Payer: Self-pay | Admitting: Orthopedic Surgery

## 2024-08-27 DIAGNOSIS — G8929 Other chronic pain: Secondary | ICD-10-CM

## 2024-08-27 MED ORDER — OXYCODONE-ACETAMINOPHEN 7.5-325 MG PO TABS: 1.0000 | ORAL_TABLET | Freq: Four times a day (QID) | ORAL | 0 refills | Status: DC | PRN

## 2024-08-27 NOTE — Progress Notes (Signed)
 Meds ordered this encounter  Medications   oxyCODONE -acetaminophen  (PERCOCET) 7.5-325 MG tablet    Sig: Take 1 tablet by mouth every 6 (six) hours as needed for up to 7 days. for pain    Dispense:  28 tablet    Refill:  0    Dr. Brenna has retired  Patient has appointment on October 31 to evaluate and get referred for chronic pain management

## 2024-09-01 DIAGNOSIS — M5442 Lumbago with sciatica, left side: Secondary | ICD-10-CM | POA: Diagnosis not present

## 2024-09-01 DIAGNOSIS — F17210 Nicotine dependence, cigarettes, uncomplicated: Secondary | ICD-10-CM | POA: Diagnosis not present

## 2024-09-01 DIAGNOSIS — G8929 Other chronic pain: Secondary | ICD-10-CM | POA: Diagnosis not present

## 2024-09-01 DIAGNOSIS — I10 Essential (primary) hypertension: Secondary | ICD-10-CM | POA: Diagnosis not present

## 2024-09-01 DIAGNOSIS — J4540 Moderate persistent asthma, uncomplicated: Secondary | ICD-10-CM | POA: Diagnosis not present

## 2024-09-01 DIAGNOSIS — M5441 Lumbago with sciatica, right side: Secondary | ICD-10-CM | POA: Diagnosis not present

## 2024-09-05 ENCOUNTER — Ambulatory Visit (INDEPENDENT_AMBULATORY_CARE_PROVIDER_SITE_OTHER): Admitting: Orthopedic Surgery

## 2024-09-05 VITALS — Ht 72.0 in | Wt 310.0 lb

## 2024-09-05 DIAGNOSIS — Z6841 Body Mass Index (BMI) 40.0 and over, adult: Secondary | ICD-10-CM

## 2024-09-05 DIAGNOSIS — G8929 Other chronic pain: Secondary | ICD-10-CM | POA: Diagnosis not present

## 2024-09-05 DIAGNOSIS — M5442 Lumbago with sciatica, left side: Secondary | ICD-10-CM | POA: Diagnosis not present

## 2024-09-05 DIAGNOSIS — M5441 Lumbago with sciatica, right side: Secondary | ICD-10-CM

## 2024-09-05 MED ORDER — OXYCODONE-ACETAMINOPHEN 7.5-325 MG PO TABS: 1.0000 | ORAL_TABLET | Freq: Four times a day (QID) | ORAL | 0 refills | Status: DC | PRN
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
## 2024-09-05 NOTE — Patient Instructions (Signed)
 Instructions regarding chronic pain management   You have chronic pain.  You are on chronic opioid therapy.  You will be referred to a chronic pain chronic opioid therapy specialist.  You have 30 days from today to continue getting your medications from this office.  After 30 days you would no longer get prescriptions for opioids from [facility].

## 2024-09-05 NOTE — Addendum Note (Signed)
 Addended by: MARCINE HUSBAND T on: 09/05/2024 11:25 AM   Modules accepted: Orders

## 2024-09-05 NOTE — Progress Notes (Signed)
  Intake history:  Chief Complaint  Patient presents with   Back Pain    Low back pain wants pain meds     Ht 6' (1.829 m)   Wt (!) 310 lb (140.6 kg)   BMI 42.04 kg/m  Body mass index is 42.04 kg/m.  Pharmacy? [facility]  WHAT ARE WE SEEING YOU FOR TODAY?   back - lumbar/sacral  Radiation?: no.   Loss of bowel/urine control?  no  How long has this bothered you? (DOI?DOS?WS?)   year(s) ago  Was there an injury? No  Anticoag.  No   Any ALLERGIES ______________________________________________   Treatment:  Have you taken:  Tylenol  Yes  Advil  Yes  Had PT No  Had injection No  Other  _________________________

## 2024-09-09 ENCOUNTER — Telehealth: Payer: Self-pay

## 2024-09-09 NOTE — Telephone Encounter (Signed)
 Error

## 2024-09-11 ENCOUNTER — Other Ambulatory Visit: Payer: Self-pay | Admitting: Orthopedic Surgery

## 2024-09-11 ENCOUNTER — Telehealth: Payer: Self-pay

## 2024-09-11 DIAGNOSIS — G8929 Other chronic pain: Secondary | ICD-10-CM

## 2024-09-11 MED ORDER — OXYCODONE-ACETAMINOPHEN 7.5-325 MG PO TABS
1.0000 | ORAL_TABLET | Freq: Four times a day (QID) | ORAL | 0 refills | Status: AC | PRN
Start: 2024-09-11 — End: 2024-09-18

## 2024-09-11 NOTE — Progress Notes (Signed)
 10/31-11/30   Meds ordered this encounter  Medications   oxyCODONE -acetaminophen  (PERCOCET) 7.5-325 MG tablet    Sig: Take 1 tablet by mouth every 6 (six) hours as needed for up to 7 days. for pain    Dispense:  28 tablet    Refill:  0

## 2024-09-11 NOTE — Telephone Encounter (Signed)
 Oxycodone -Acetaminophen   7.5/325 MG   Take 1 tablet by mouth every 6 (six) hours as needed for up to 7 days. for pain   PATIENT USES [facility]

## 2024-09-17 ENCOUNTER — Other Ambulatory Visit: Payer: Self-pay | Admitting: Orthopedic Surgery

## 2024-09-17 ENCOUNTER — Telehealth: Payer: Self-pay | Admitting: Orthopedic Surgery

## 2024-09-17 DIAGNOSIS — G8929 Other chronic pain: Secondary | ICD-10-CM

## 2024-09-17 NOTE — Telephone Encounter (Signed)
 Dr. Areatha pt - pt presented to the office requesting a refill for Hydrocodone  7.5-325, 28 tablets, every 6hrs PRN to be sent to [facility]

## 2024-09-17 NOTE — Telephone Encounter (Signed)
 There is already a refill request pending with Dr Margrette

## 2024-09-18 ENCOUNTER — Telehealth: Payer: Self-pay | Admitting: Orthopedic Surgery

## 2024-09-18 ENCOUNTER — Other Ambulatory Visit: Payer: Self-pay | Admitting: Orthopedic Surgery

## 2024-09-18 DIAGNOSIS — G8929 Other chronic pain: Secondary | ICD-10-CM

## 2024-09-18 NOTE — Telephone Encounter (Signed)
 Tried to call the pt back and advised, no answer and no vm

## 2024-09-18 NOTE — Telephone Encounter (Signed)
 Dr. Areatha pt - pt lvm stating that the pharmacy told him his request was denied, he would like a call explaining why.  [phone]

## 2024-09-22 ENCOUNTER — Other Ambulatory Visit: Payer: Self-pay | Admitting: Orthopedic Surgery

## 2024-09-22 DIAGNOSIS — G8929 Other chronic pain: Secondary | ICD-10-CM

## 2024-09-22 MED ORDER — OXYCODONE-ACETAMINOPHEN 7.5-325 MG PO TABS
1.0000 | ORAL_TABLET | Freq: Four times a day (QID) | ORAL | 0 refills | Status: DC | PRN
Start: 2024-09-22 — End: 2024-09-24

## 2024-09-22 NOTE — Progress Notes (Signed)
 Meds ordered this encounter  Medications   oxyCODONE -acetaminophen  (PERCOCET) 7.5-325 MG tablet    Sig: Take 1 tablet by mouth every 6 (six) hours as needed for severe pain (pain score 7-10).    Dispense:  28 tablet    Refill:  0

## 2024-09-24 ENCOUNTER — Other Ambulatory Visit: Payer: Self-pay | Admitting: Orthopedic Surgery

## 2024-09-24 ENCOUNTER — Telehealth: Payer: Self-pay | Admitting: Orthopedic Surgery

## 2024-09-24 DIAGNOSIS — G8929 Other chronic pain: Secondary | ICD-10-CM

## 2024-09-24 MED ORDER — OXYCODONE-ACETAMINOPHEN 7.5-325 MG PO TABS
1.0000 | ORAL_TABLET | Freq: Four times a day (QID) | ORAL | 0 refills | Status: DC | PRN
Start: 2024-09-24 — End: 2024-09-29

## 2024-09-24 NOTE — Telephone Encounter (Signed)
  Dr. Areatha pt - spoke w/the pt, he is requesting a refill for Oxycodone  7.5-325, 28 tablets (7 day supply), every 6 hours PRN for severe pain (pain score 7-10) to be sent to [facility]

## 2024-09-25 ENCOUNTER — Encounter: Payer: Self-pay | Admitting: Radiology

## 2024-09-25 ENCOUNTER — Other Ambulatory Visit: Payer: Self-pay | Admitting: Radiology

## 2024-09-25 ENCOUNTER — Telehealth: Payer: Self-pay | Admitting: Orthopedic Surgery

## 2024-09-25 NOTE — Telephone Encounter (Signed)
 I called him to advise   Hayden Villa states he wanted to go to [facility]  He needs to know they don't  do medication management  He has gotten his last prescription from Dr Margrette   He can call [facility] for the appointment  I do not know the number, but when Sabrina discussed it with him maybe she gave him the number  No answer no voicemail

## 2024-09-25 NOTE — Telephone Encounter (Signed)
 Samule put in the referral and failed to give patient the phone number I was working in xray that morning   [facility] [employer] [facility] [employer]  [phone]  We will send referral there, you call next week to schedule, they will call you too.   Is what he was supposed to receive  I will call him this afternoon at 430 if I have time after I put in the orders  Otherwise it will be Tuesday next week  His month ends the end of this month  If he calls back will you give him the phone number   Samule have they called him from pain management yet

## 2024-09-25 NOTE — Telephone Encounter (Signed)
 Dr. Areatha pt - pt lvm stating he has not heard from the pain clinic.  He is asking for a call back.  [phone]

## 2024-09-25 NOTE — Telephone Encounter (Addendum)
 Ok what is the timeframe? They only get one month of meds and does patient  know they do not do medication management ?

## 2024-09-25 NOTE — Progress Notes (Signed)
 I called him to advise and no answer no voice mail  If he calls back he needs to be told to call [facility]  They should have the referral  They will not give him medicine and he has gotten the last one from Dr Margrette Husband maybe already gave him the number? I do not have it.   Hus[facility] states [facility].

## 2024-09-26 ENCOUNTER — Other Ambulatory Visit: Payer: Self-pay | Admitting: Internal Medicine

## 2024-09-29 ENCOUNTER — Other Ambulatory Visit: Payer: Self-pay | Admitting: Orthopedic Surgery

## 2024-09-29 DIAGNOSIS — G8929 Other chronic pain: Secondary | ICD-10-CM

## 2024-09-29 NOTE — Telephone Encounter (Signed)
 Dr. Areatha pt - spoke w/the pt, he is worried about getting his refill for Oxycodone  7.5-325 this week.  He stated it's due on Thanksgiving and us  and the pharmacy will be closed.  He uses [facility].  [phone]

## 2024-09-30 MED ORDER — OXYCODONE-ACETAMINOPHEN 7.5-325 MG PO TABS: 1.0000 | ORAL_TABLET | Freq: Four times a day (QID) | ORAL | 0 refills | Status: DC | PRN

## 2024-10-07 ENCOUNTER — Other Ambulatory Visit: Payer: Self-pay

## 2024-10-07 ENCOUNTER — Encounter (HOSPITAL_COMMUNITY): Payer: Self-pay

## 2024-10-07 ENCOUNTER — Emergency Department (HOSPITAL_COMMUNITY)
Admission: EM | Admit: 2024-10-07 | Discharge: 2024-10-07 | Disposition: A | Discharge status: Home / Self Care | Attending: Emergency Medicine | Admitting: Emergency Medicine

## 2024-10-07 ENCOUNTER — Emergency Department (HOSPITAL_COMMUNITY)

## 2024-10-07 DIAGNOSIS — S39012A Strain of muscle, fascia and tendon of lower back, initial encounter: Secondary | ICD-10-CM

## 2024-10-07 DIAGNOSIS — S161XXA Strain of muscle, fascia and tendon at neck level, initial encounter: Secondary | ICD-10-CM

## 2024-10-07 MED ORDER — OXYCODONE-ACETAMINOPHEN 5-325 MG PO TABS
1.0000 | ORAL_TABLET | Freq: Once | ORAL | Status: AC
  Administered 2024-10-07: 1 via ORAL
  Filled 2024-10-07: qty 1

## 2024-10-07 MED ORDER — METHOCARBAMOL 750 MG PO TABS: 750.0000 mg | ORAL_TABLET | Freq: Three times a day (TID) | ORAL | 0 refills | Status: AC
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
## 2024-10-07 NOTE — Discharge Instructions (Signed)
 Please follow-up closely with your primary care doctor on an outpatient basis.  Return to emergency department immediately for any new or worsening symptoms.
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
## 2024-10-07 NOTE — ED Notes (Signed)
Patient transported to CT via w/c.

## 2024-10-13 ENCOUNTER — Other Ambulatory Visit: Payer: Self-pay | Admitting: Orthopedic Surgery

## 2024-10-13 DIAGNOSIS — G8929 Other chronic pain: Secondary | ICD-10-CM

## 2024-10-13 NOTE — Telephone Encounter (Signed)
 Patient was referred to pain management I was not working with you that day Sabrina sent referral to [facility] said they would call patient 2 weeks ago with appointment I messaged her last week for update she has not responded  Can you advise on continued refills?  I think maybe Samule did give him the phone number to call to schedule but I can not be certain.

## 2024-10-14 NOTE — Telephone Encounter (Signed)
 I called him to advise no more Oxycodone  from Dr Margrette and Dr Onesimo  He states Jan 6th is his appointment for pain management
